# Patient Record
Sex: Female | Born: 1955
Health system: Southern US, Community
[De-identification: ages and names within clinical notes are randomized; demographics above are authoritative.]

## PROBLEM LIST (undated history)

## (undated) DIAGNOSIS — M8580 Other specified disorders of bone density and structure, unspecified site: Secondary | ICD-10-CM

## (undated) DIAGNOSIS — G43909 Migraine, unspecified, not intractable, without status migrainosus: Secondary | ICD-10-CM

## (undated) DIAGNOSIS — J45909 Unspecified asthma, uncomplicated: Secondary | ICD-10-CM

## (undated) DIAGNOSIS — N83209 Unspecified ovarian cyst, unspecified side: Secondary | ICD-10-CM

## (undated) DIAGNOSIS — H04129 Dry eye syndrome of unspecified lacrimal gland: Secondary | ICD-10-CM

## (undated) DIAGNOSIS — F329 Major depressive disorder, single episode, unspecified: Secondary | ICD-10-CM

## (undated) DIAGNOSIS — K819 Cholecystitis, unspecified: Secondary | ICD-10-CM

## (undated) DIAGNOSIS — I209 Angina pectoris, unspecified: Secondary | ICD-10-CM

## (undated) DIAGNOSIS — F32A Depression, unspecified: Secondary | ICD-10-CM

## (undated) DIAGNOSIS — R7611 Nonspecific reaction to tuberculin skin test without active tuberculosis: Secondary | ICD-10-CM

## (undated) HISTORY — DX: Nonspecific reaction to tuberculin skin test without active tuberculosis: R76.11

## (undated) HISTORY — DX: Unspecified asthma, uncomplicated: J45.909

## (undated) HISTORY — DX: Unspecified ovarian cyst, unspecified side: N83.209

## (undated) HISTORY — DX: Other specified disorders of bone density and structure, unspecified site: M85.80

## (undated) HISTORY — DX: Dry eye syndrome of unspecified lacrimal gland: H04.129

---

## 1979-01-28 HISTORY — PX: OTHER SURGICAL HISTORY: SHX169

## 1980-01-28 HISTORY — PX: ABDOMINAL HYSTERECTOMY: SHX81

## 1995-01-28 HISTORY — PX: TONSILLECTOMY AND ADENOIDECTOMY: SUR1326

## 1997-06-27 ENCOUNTER — Other Ambulatory Visit: Admission: RE | Admit: 1997-06-27 | Discharge: 1997-06-27 | Payer: Self-pay | Admitting: Obstetrics and Gynecology

## 1997-06-30 ENCOUNTER — Ambulatory Visit (HOSPITAL_COMMUNITY): Admission: RE | Admit: 1997-06-30 | Discharge: 1997-06-30 | Payer: Self-pay | Admitting: Obstetrics and Gynecology

## 1997-08-07 ENCOUNTER — Ambulatory Visit (HOSPITAL_COMMUNITY): Admission: RE | Admit: 1997-08-07 | Discharge: 1997-08-07 | Payer: Self-pay | Admitting: Oral & Maxillofacial Surgery

## 1998-06-26 ENCOUNTER — Encounter: Payer: Self-pay | Admitting: Obstetrics and Gynecology

## 1998-06-26 ENCOUNTER — Ambulatory Visit (HOSPITAL_COMMUNITY): Admission: RE | Admit: 1998-06-26 | Discharge: 1998-06-26 | Payer: Self-pay | Admitting: Obstetrics and Gynecology

## 1999-09-17 ENCOUNTER — Encounter: Payer: Self-pay | Admitting: Obstetrics and Gynecology

## 1999-09-17 ENCOUNTER — Ambulatory Visit (HOSPITAL_COMMUNITY): Admission: RE | Admit: 1999-09-17 | Discharge: 1999-09-17 | Payer: Self-pay | Admitting: Obstetrics and Gynecology

## 2000-03-20 ENCOUNTER — Other Ambulatory Visit: Admission: RE | Admit: 2000-03-20 | Discharge: 2000-03-20 | Payer: Self-pay | Admitting: Obstetrics and Gynecology

## 2001-05-04 ENCOUNTER — Ambulatory Visit (HOSPITAL_COMMUNITY): Admission: RE | Admit: 2001-05-04 | Discharge: 2001-05-04 | Payer: Self-pay | Admitting: Obstetrics and Gynecology

## 2001-05-04 ENCOUNTER — Encounter: Payer: Self-pay | Admitting: Obstetrics and Gynecology

## 2002-05-09 ENCOUNTER — Encounter: Payer: Self-pay | Admitting: Obstetrics and Gynecology

## 2002-05-09 ENCOUNTER — Encounter: Admission: RE | Admit: 2002-05-09 | Discharge: 2002-05-09 | Payer: Self-pay | Admitting: Obstetrics and Gynecology

## 2003-05-11 ENCOUNTER — Encounter: Admission: RE | Admit: 2003-05-11 | Discharge: 2003-05-11 | Payer: Self-pay | Admitting: Obstetrics and Gynecology

## 2004-04-21 ENCOUNTER — Emergency Department (HOSPITAL_COMMUNITY): Admission: EM | Admit: 2004-04-21 | Discharge: 2004-04-21 | Payer: Self-pay | Admitting: Family Medicine

## 2004-05-13 ENCOUNTER — Encounter: Admission: RE | Admit: 2004-05-13 | Discharge: 2004-05-13 | Payer: Self-pay | Admitting: Obstetrics and Gynecology

## 2004-08-21 ENCOUNTER — Encounter: Admission: RE | Admit: 2004-08-21 | Discharge: 2004-08-21 | Payer: Self-pay | Admitting: Internal Medicine

## 2004-08-29 ENCOUNTER — Encounter: Admission: RE | Admit: 2004-08-29 | Discharge: 2004-08-29 | Payer: Self-pay | Admitting: Internal Medicine

## 2004-09-04 ENCOUNTER — Encounter: Admission: RE | Admit: 2004-09-04 | Discharge: 2004-09-04 | Payer: Self-pay | Admitting: Internal Medicine

## 2005-05-16 ENCOUNTER — Ambulatory Visit (HOSPITAL_COMMUNITY): Admission: RE | Admit: 2005-05-16 | Discharge: 2005-05-16 | Payer: Self-pay | Admitting: Obstetrics & Gynecology

## 2005-07-07 ENCOUNTER — Ambulatory Visit (HOSPITAL_COMMUNITY): Admission: RE | Admit: 2005-07-07 | Discharge: 2005-07-07 | Payer: Self-pay | Admitting: Gastroenterology

## 2005-07-07 ENCOUNTER — Encounter (INDEPENDENT_AMBULATORY_CARE_PROVIDER_SITE_OTHER): Payer: Self-pay | Admitting: Specialist

## 2006-06-09 ENCOUNTER — Ambulatory Visit (HOSPITAL_COMMUNITY): Admission: RE | Admit: 2006-06-09 | Discharge: 2006-06-09 | Payer: Self-pay | Admitting: Obstetrics & Gynecology

## 2006-08-02 IMAGING — CR DG FOOT COMPLETE 3+V*L*
3 series · 3 of 3 positions shown · non-contrast
Comparison: none

CLINICAL DATA: Stepped in a hole.  Pain and swelling particularly laterally.  
 LEFT ANKLE ? 3 VIEW:
 No joint effusion.  There is some lateral soft tissue swelling but no evidence of fracture or dislocation.

[view not recorded (1 of 3)]
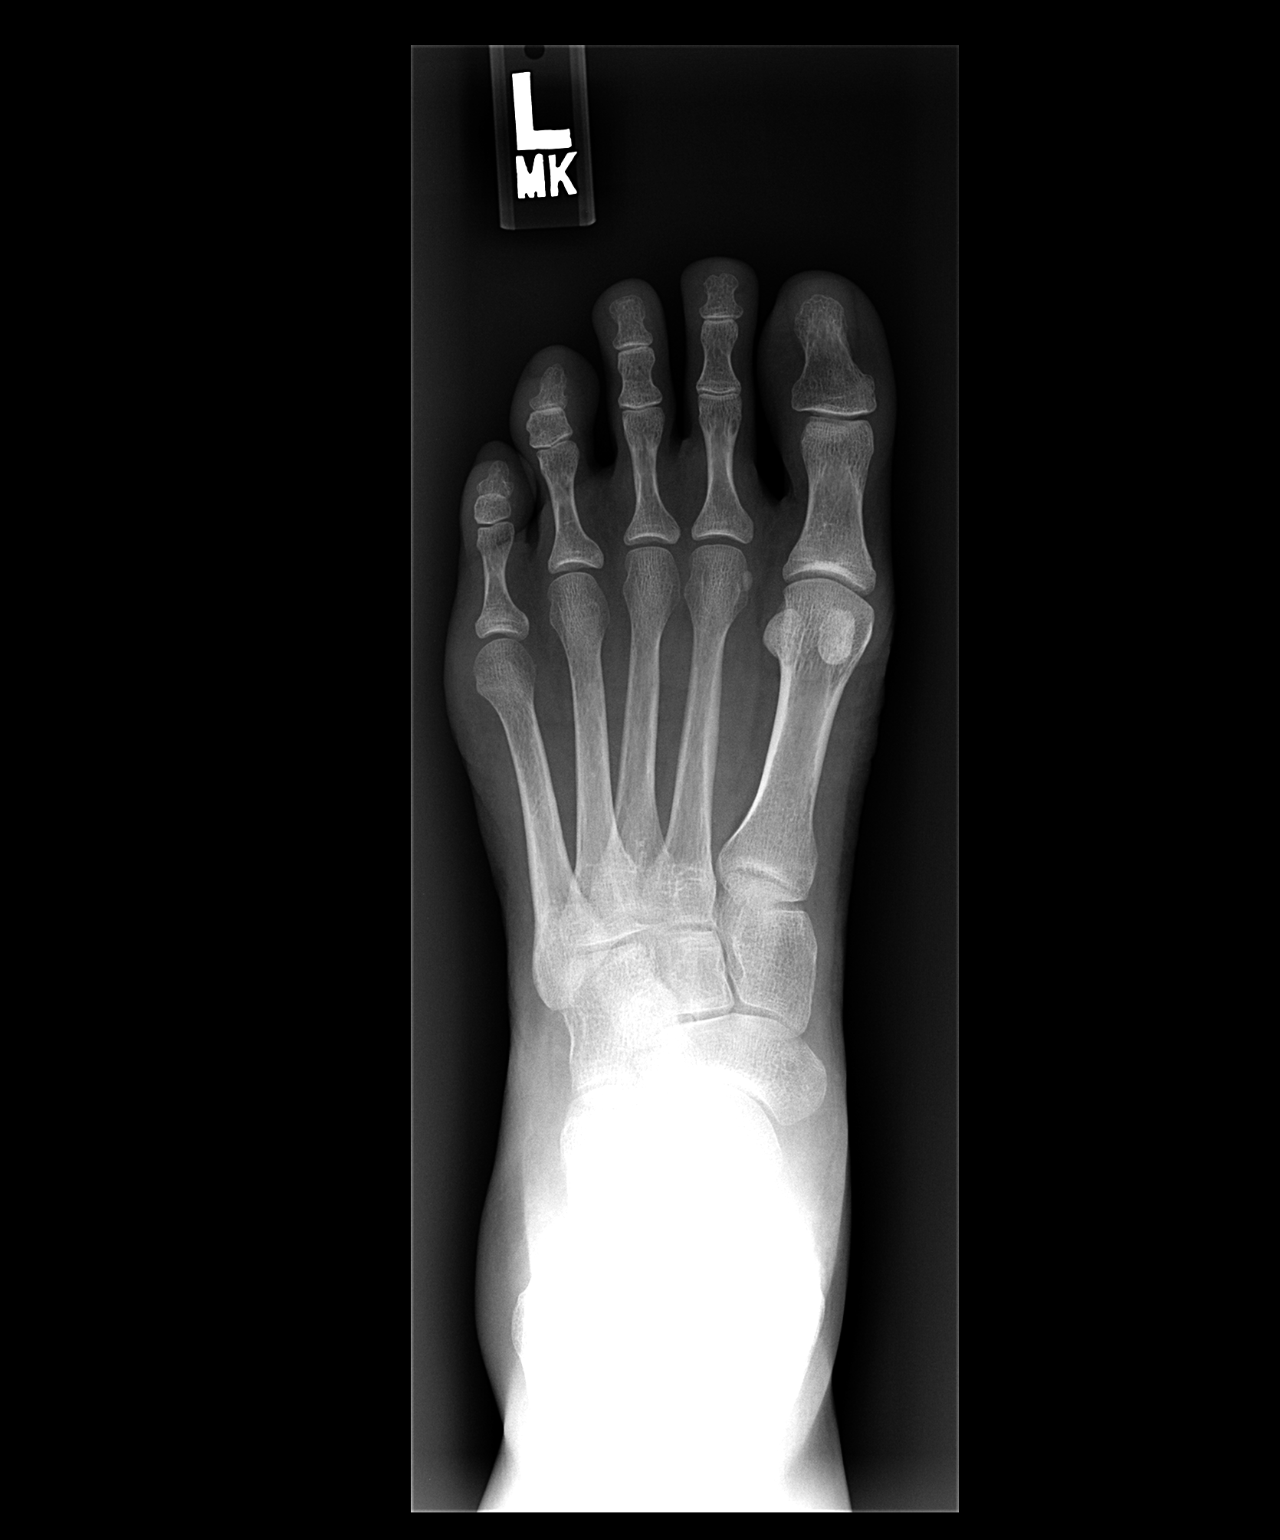

[view not recorded (2 of 3)]
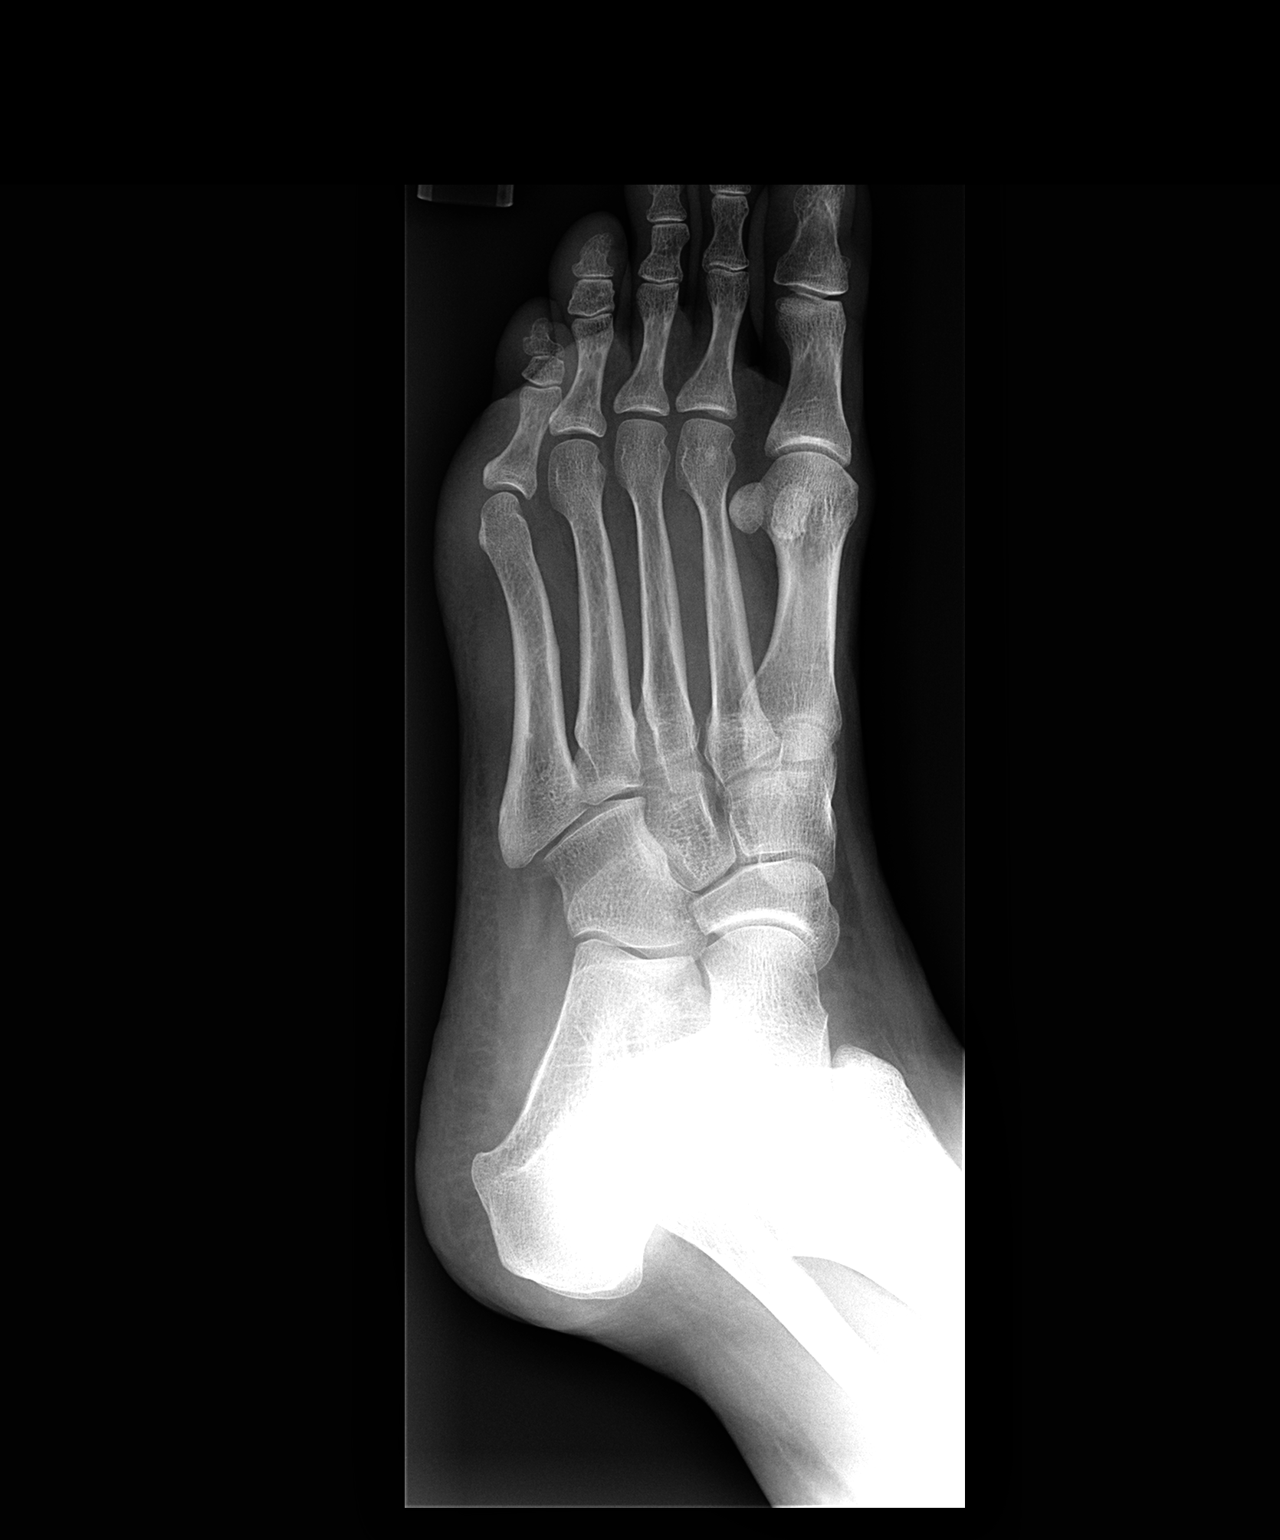

[view not recorded (3 of 3)]
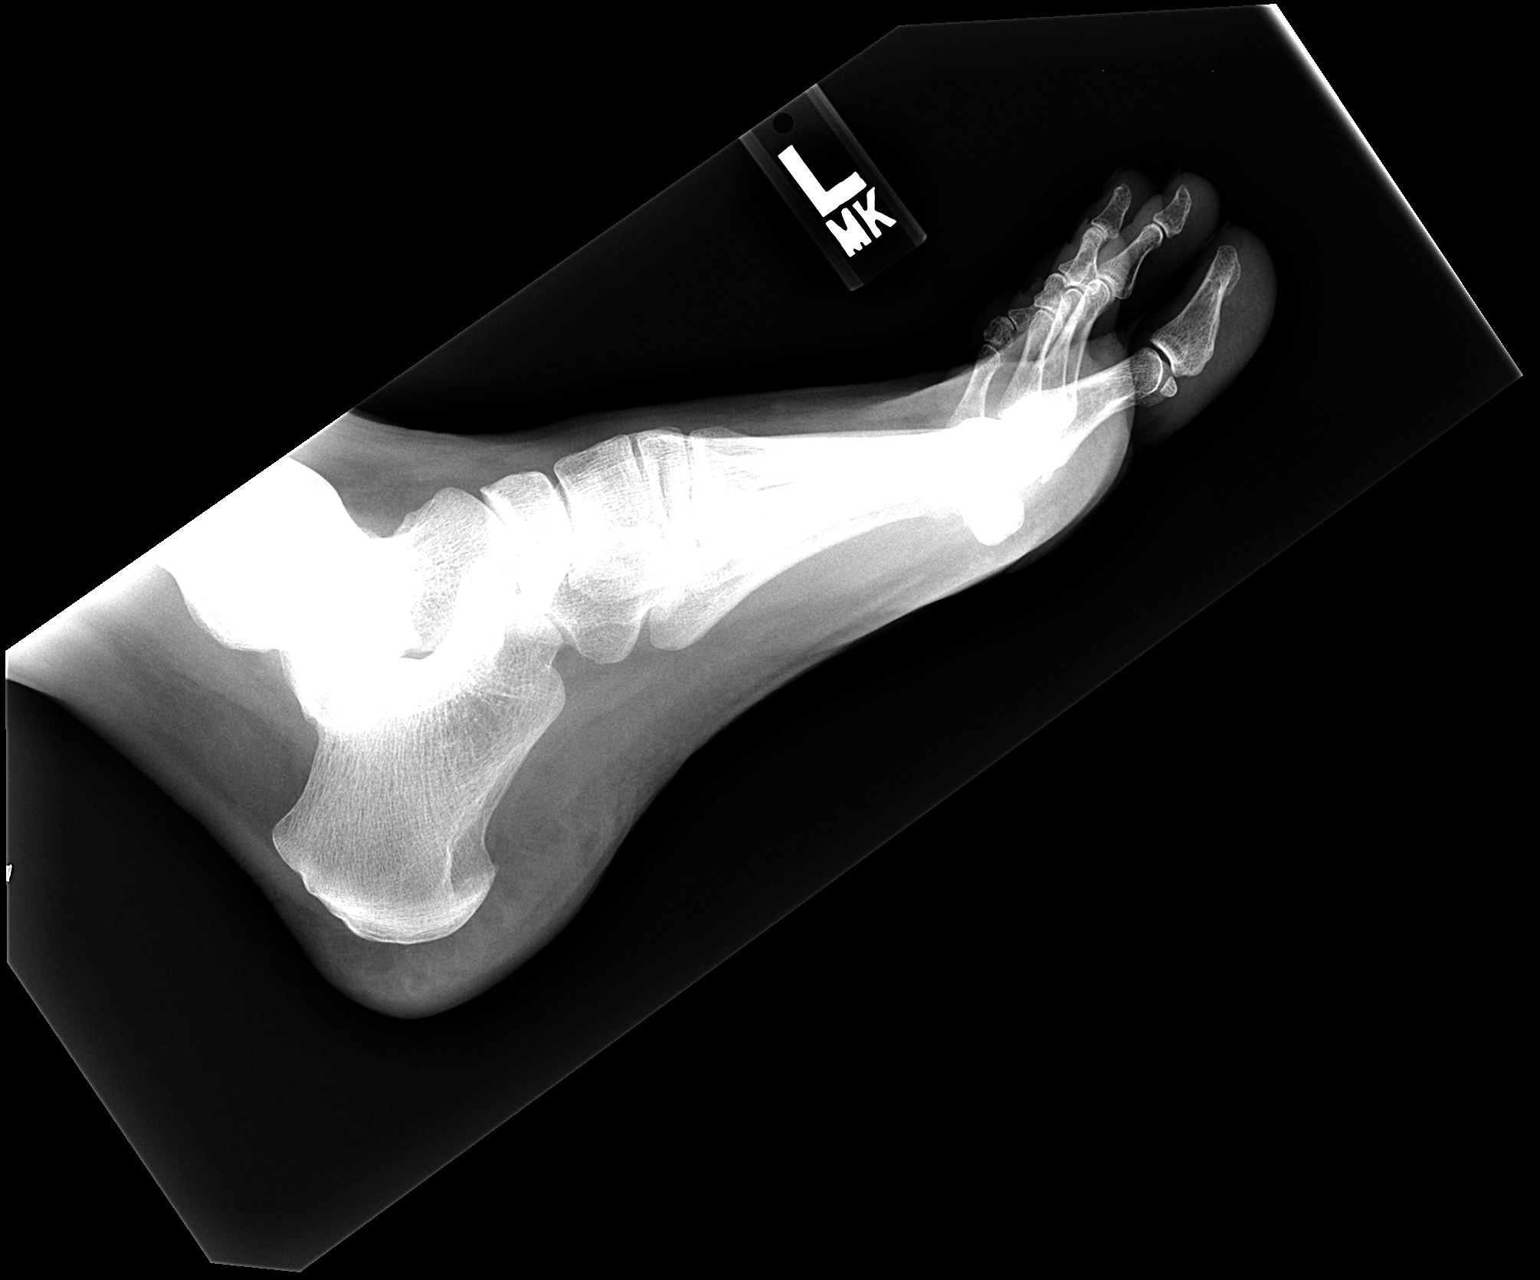

[3 of 3 positions shown; findings below may reference images not displayed]

IMPRESSION: See above report.
 LEFT FOOT ? 3 VIEW:
 No evidence of fracture or dislocation.
IMPRESSION: Negative.

## 2007-06-22 ENCOUNTER — Ambulatory Visit (HOSPITAL_COMMUNITY): Admission: RE | Admit: 2007-06-22 | Discharge: 2007-06-22 | Payer: Self-pay | Admitting: Obstetrics & Gynecology

## 2007-06-28 ENCOUNTER — Other Ambulatory Visit: Admission: RE | Admit: 2007-06-28 | Discharge: 2007-06-28 | Payer: Self-pay | Admitting: Obstetrics & Gynecology

## 2008-06-22 ENCOUNTER — Ambulatory Visit (HOSPITAL_COMMUNITY): Admission: RE | Admit: 2008-06-22 | Discharge: 2008-06-22 | Payer: Self-pay | Admitting: Obstetrics & Gynecology

## 2009-07-03 ENCOUNTER — Ambulatory Visit (HOSPITAL_COMMUNITY): Admission: RE | Admit: 2009-07-03 | Discharge: 2009-07-03 | Payer: Self-pay | Admitting: Obstetrics & Gynecology

## 2010-02-17 ENCOUNTER — Encounter: Payer: Self-pay | Admitting: Obstetrics & Gynecology

## 2010-06-14 NOTE — Op Note (Signed)
NAME:  Miranda Valentine, Miranda Valentine                ACCOUNT NO.:  [id number]   MEDICAL RECORD NO.:  [id number]          PATIENT TYPE:  AMB   LOCATION:  ENDO                         FACILITY:  [facility]   PHYSICIAN:  Anselmo Rod, M.D.  DATE OF BIRTH:  [birth year]   DATE OF PROCEDURE:  07/07/2005  DATE OF DISCHARGE:                                 OPERATIVE REPORT   PROCEDURE PERFORMED:  Colonoscopy with cold biopsies x 2.   ENDOSCOPIST:  Anselmo Rod, M.D.   INSTRUMENT USED:  Olympus video colonoscope.   INDICATIONS FOR PROCEDURE:  A 55 year old white female undergoing screening  colonoscopy to rule out colonic polyps, masses, etc.   PREPROCEDURE PREPARATION:  Informed consent was procured from the patient.  The patient was fasted for four hours prior to the procedure and prepped  with OsmoPrep pills the night of and the morning of the procedure.  The  risks and benefits of the procedure including a 10% miss rate for cancer or  polyps was discussed with the patient as well.   PREPROCEDURE PHYSICAL:  The patient had stable vital signs.  Neck supple.  Chest clear to auscultation.  S1 and S2 regular.  Abdomen soft with normal  bowel sounds.   DESCRIPTION OF PROCEDURE:  The patient was placed in left lateral decubitus  position and sedated with 50 mcg of fentanyl and  mg of Versed in slow  incremental doses.  Once the patient was adequately sedated and maintained  on low flow oxygen and continuous cardiac monitoring, the Olympus video  colonoscope was advanced from the rectum to the cecum.  The appendicular  orifice and ileocecal valve were clearly visualized and photographed.  The  terminal ileum appeared normal and without lesion.  Two small sessile polyps  were biopsied from the cecum (cold biopsies x2).  The rest of the exam was  unremarkable.  Retroflexion in the rectum revealed small internal  hemorrhoids.  Rest of the colonic mucosa appeared healthy and without  lesions.   IMPRESSION:   1.Small internal hemorrhoids.  2.Two small sessile polyps biopsied from the cecum.  3.Otherwise normal exam up to the terminal ileum.   RECOMMENDATIONS:  1.Await pathology results.  2.Avoid all nonsteroidals including aspirin for the next two weeks.  3.Outpatient followup as needed in the future.      Anselmo Rod, M.D.  Electronically Signed     JNM/MEDQ  D:  07/07/2005  T:  07/07/2005  Job:  [id number]   cc:   M. Leda Quail, MD   Candyce Churn. Allyne Gee, M.D.  Fax: [phone]

## 2010-06-14 NOTE — Op Note (Signed)
NAME:  Miranda Valentine, Miranda Valentine                ACCOUNT NO.:  [id number]   MEDICAL RECORD NO.:  [id number]          PATIENT TYPE:  AMB   LOCATION:  ENDO                         FACILITY:  [facility]   PHYSICIAN:  Anselmo Rod, M.D.  DATE OF BIRTH:  [birth year]   DATE OF PROCEDURE:  07/07/2005  DATE OF DISCHARGE:                                 OPERATIVE REPORT   PROCEDURE:  Colonoscopy with cold biopsies x2.   ENDOSCOPIST:  Anselmo Rod, M.D.   INSTRUMENT USED:  Olympus video colonoscope.   INDICATIONS FOR PROCEDURE:  This 55 year old white female underwent a  screening colonoscopy to rule out colonic polyps, masses, etc.   PRE-PROCEDURE PREPARATION:  An informed consent was procured from the  patient.  The patient was fasted for four hours prior to prior to the  procedure and prepped with Osmo prep on the morning of the procedure.  The  risks and benefits of the procedure, including a 10% miss rate of cancer  were discussed with the patient as well.   PRE-PROCEDURE PHYSICAL EXAMINATION:  VITAL SIGNS:  Stable.  NECK:  Supple.  CHEST:  Clear to auscultation.  HEART:  S1, S2 regular.  ABDOMEN:  Soft with normal bowel sounds.   DESCRIPTION OF PROCEDURE:  The patient was placed in the left lateral  decubitus position and sedated with 50 mcg of fentanyl and 5 mg of Versed in  slow incremental doses. Once the patient was adequately sedated and  maintained on low-flow oxygen and continuous cardiac monitoring, the Olympus  video colonoscope was advanced from the rectum to the cecum.  The  appendiceal orifice and ileocecal valve were visualized and photographed.  Two small sessile polyps was removed by cold biopsy forceps from the  rectocecum. The terminal ileum appeared healthy and without lesions.  Small  internal hemorrhoids were seen on retroflexion.  The rest of the exam was  unremarkable.   The patient tolerated the procedure well without complications.  There was  no evidence of  diverticulosis.   IMPRESSION:  1.Small internal hemorrhoids.  2.Two small sessile polyps, biopsied from the cecum.  3.Otherwise normal examination up to the terminal ileum.   RECOMMENDATIONS:  1.Await pathology results.  2.Avoid all non-steroidals including aspirin for the next two weeks.  3.Repeat colonoscopy, depending upon the pathology results.  4.Outpatient followup as the need arises in the future.      Anselmo Rod, M.D.  Electronically Signed     JNM/MEDQ  D:  07/07/2005  T:  07/07/2005  Job:  [id number]   cc:   M. Leda Quail, MD   Candyce Churn. Allyne Gee, M.D.  Fax: [phone]

## 2010-06-26 ENCOUNTER — Other Ambulatory Visit: Payer: Self-pay | Admitting: Obstetrics & Gynecology

## 2010-06-26 DIAGNOSIS — Z1231 Encounter for screening mammogram for malignant neoplasm of breast: Secondary | ICD-10-CM

## 2010-07-16 ENCOUNTER — Ambulatory Visit (HOSPITAL_COMMUNITY)
Admission: RE | Admit: 2010-07-16 | Discharge: 2010-07-16 | Disposition: A | Discharge status: Home / Self Care | Payer: 59 | Source: Ambulatory Visit | Attending: Obstetrics & Gynecology | Admitting: Obstetrics & Gynecology

## 2010-07-16 DIAGNOSIS — Z1231 Encounter for screening mammogram for malignant neoplasm of breast: Secondary | ICD-10-CM | POA: Insufficient documentation

## 2011-06-20 ENCOUNTER — Other Ambulatory Visit: Payer: Self-pay | Admitting: Obstetrics & Gynecology

## 2011-06-20 DIAGNOSIS — Z1231 Encounter for screening mammogram for malignant neoplasm of breast: Secondary | ICD-10-CM

## 2011-07-01 ENCOUNTER — Other Ambulatory Visit: Payer: Self-pay | Admitting: Obstetrics & Gynecology

## 2011-07-01 DIAGNOSIS — Z13820 Encounter for screening for osteoporosis: Secondary | ICD-10-CM

## 2011-07-21 ENCOUNTER — Ambulatory Visit (HOSPITAL_COMMUNITY): Payer: 59

## 2011-07-24 ENCOUNTER — Other Ambulatory Visit (HOSPITAL_COMMUNITY): Payer: Self-pay | Admitting: Obstetrics & Gynecology

## 2011-07-24 DIAGNOSIS — Z1231 Encounter for screening mammogram for malignant neoplasm of breast: Secondary | ICD-10-CM

## 2011-07-25 ENCOUNTER — Ambulatory Visit (HOSPITAL_COMMUNITY)
Admission: RE | Admit: 2011-07-25 | Discharge: 2011-07-25 | Disposition: A | Discharge status: Home / Self Care | Payer: 59 | Source: Ambulatory Visit | Attending: Obstetrics & Gynecology | Admitting: Obstetrics & Gynecology

## 2011-07-25 DIAGNOSIS — Z1231 Encounter for screening mammogram for malignant neoplasm of breast: Secondary | ICD-10-CM | POA: Insufficient documentation

## 2011-07-25 DIAGNOSIS — Z13820 Encounter for screening for osteoporosis: Secondary | ICD-10-CM

## 2011-11-07 ENCOUNTER — Observation Stay (HOSPITAL_COMMUNITY)
Admission: EM | Admit: 2011-11-07 | Discharge: 2011-11-08 | Disposition: A | Discharge status: Home / Self Care | Payer: 59 | Attending: Cardiovascular Disease | Admitting: Cardiovascular Disease

## 2011-11-07 ENCOUNTER — Emergency Department (HOSPITAL_COMMUNITY): Payer: 59

## 2011-11-07 ENCOUNTER — Other Ambulatory Visit: Payer: Self-pay

## 2011-11-07 ENCOUNTER — Encounter (HOSPITAL_COMMUNITY): Payer: Self-pay | Admitting: *Deleted

## 2011-11-07 DIAGNOSIS — I209 Angina pectoris, unspecified: Secondary | ICD-10-CM

## 2011-11-07 DIAGNOSIS — Z8249 Family history of ischemic heart disease and other diseases of the circulatory system: Secondary | ICD-10-CM | POA: Insufficient documentation

## 2011-11-07 DIAGNOSIS — G43909 Migraine, unspecified, not intractable, without status migrainosus: Secondary | ICD-10-CM | POA: Diagnosis present

## 2011-11-07 DIAGNOSIS — R0789 Other chest pain: Secondary | ICD-10-CM | POA: Diagnosis present

## 2011-11-07 DIAGNOSIS — R079 Chest pain, unspecified: Principal | ICD-10-CM | POA: Insufficient documentation

## 2011-11-07 DIAGNOSIS — F3289 Other specified depressive episodes: Secondary | ICD-10-CM | POA: Insufficient documentation

## 2011-11-07 DIAGNOSIS — F329 Major depressive disorder, single episode, unspecified: Secondary | ICD-10-CM | POA: Insufficient documentation

## 2011-11-07 HISTORY — DX: Angina pectoris, unspecified: I20.9

## 2011-11-07 HISTORY — DX: Major depressive disorder, single episode, unspecified: F32.9

## 2011-11-07 HISTORY — DX: Depression, unspecified: F32.A

## 2011-11-07 HISTORY — DX: Migraine, unspecified, not intractable, without status migrainosus: G43.909

## 2011-11-07 LAB — CBC
HCT: 37.6 % (ref 36.0–46.0)
Hemoglobin: 12.5 g/dL (ref 12.0–15.0)
MCH: 29.2 pg (ref 26.0–34.0)
MCHC: 33.2 g/dL (ref 30.0–36.0)
MCV: 87.9 fL (ref 78.0–100.0)
Platelets: 258 10*3/uL (ref 150–400)
RBC: 4.28 MIL/uL (ref 3.87–5.11)
RDW: 12.7 % (ref 11.5–15.5)
WBC: 4.9 10*3/uL (ref 4.0–10.5)

## 2011-11-07 LAB — COMPREHENSIVE METABOLIC PANEL
ALT: 17 U/L (ref 0–35)
AST: 17 U/L (ref 0–37)
Albumin: 3.7 g/dL (ref 3.5–5.2)
Alkaline Phosphatase: 101 U/L (ref 39–117)
BUN: 19 mg/dL (ref 6–23)
CO2: 23 mEq/L (ref 19–32)
Calcium: 9.7 mg/dL (ref 8.4–10.5)
Chloride: 108 mEq/L (ref 96–112)
Creatinine, Ser: 0.71 mg/dL (ref 0.50–1.10)
GFR calc Af Amer: >90 mL/min (ref >90)
GFR calc non Af Amer: >90 mL/min (ref >90)
Glucose, Bld: 67 mg/dL — ABNORMAL LOW (ref 70–99)
Potassium: 3.8 mEq/L (ref 3.5–5.1)
Sodium: 141 mEq/L (ref 135–145)
Total Bilirubin: 0.2 mg/dL — ABNORMAL LOW (ref 0.3–1.2)
Total Protein: 6.9 g/dL (ref 6.0–8.3)

## 2011-11-07 LAB — D-DIMER, QUANTITATIVE: D-Dimer, Quant: <0.27 ug/mL-FEU (ref 0.00–0.48)

## 2011-11-07 LAB — CBC WITH DIFFERENTIAL/PLATELET
Basophils Absolute: 0 10*3/uL (ref 0.0–0.1)
Basophils Relative: 0 % (ref 0–1)
Eosinophils Absolute: 0.2 10*3/uL (ref 0.0–0.7)
Eosinophils Relative: 3 % (ref 0–5)
HCT: 39.7 % (ref 36.0–46.0)
Hemoglobin: 13 g/dL (ref 12.0–15.0)
Lymphocytes Relative: 37 % (ref 12–46)
Lymphs Abs: 1.8 10*3/uL (ref 0.7–4.0)
MCH: 29 pg (ref 26.0–34.0)
MCHC: 32.7 g/dL (ref 30.0–36.0)
MCV: 88.6 fL (ref 78.0–100.0)
Monocytes Absolute: 0.3 10*3/uL (ref 0.1–1.0)
Monocytes Relative: 6 % (ref 3–12)
Neutro Abs: 2.6 10*3/uL (ref 1.7–7.7)
Neutrophils Relative %: 54 % (ref 43–77)
Platelets: 276 10*3/uL (ref 150–400)
RBC: 4.48 MIL/uL (ref 3.87–5.11)
RDW: 12.6 % (ref 11.5–15.5)
WBC: 4.9 10*3/uL (ref 4.0–10.5)

## 2011-11-07 LAB — POCT I-STAT TROPONIN I: Troponin i, poc: 0 ng/mL (ref 0.00–0.08)

## 2011-11-07 MED ORDER — ADULT MULTIVITAMIN W/MINERALS CH
1.0000 | ORAL_TABLET | Freq: Every day | ORAL | Status: DC
  Administered 2011-11-08: 1 via ORAL
  Filled 2011-11-07: qty 1

## 2011-11-07 MED ORDER — ENOXAPARIN SODIUM 40 MG/0.4ML ~~LOC~~ SOLN
40.0000 mg | SUBCUTANEOUS | Status: DC
  Administered 2011-11-07: 40 mg via SUBCUTANEOUS
  Filled 2011-11-07 (×2): qty 0.4

## 2011-11-07 MED ORDER — ARIPIPRAZOLE 2 MG PO TABS
2.0000 mg | ORAL_TABLET | Freq: Every day | ORAL | Status: DC
  Administered 2011-11-08: 2 mg via ORAL
  Filled 2011-11-07: qty 1

## 2011-11-07 MED ORDER — SODIUM CHLORIDE 0.9 % IJ SOLN: 3.0000 mL | INTRAMUSCULAR | Status: DC | PRN

## 2011-11-07 MED ORDER — AZELASTINE HCL 0.1 % NA SOLN
2.0000 | Freq: Every day | NASAL | Status: DC
  Administered 2011-11-08: 2 via NASAL
  Filled 2011-11-07: qty 30

## 2011-11-07 MED ORDER — SERTRALINE HCL 50 MG PO TABS: 250.0000 mg | ORAL_TABLET | Freq: Every day | ORAL | Status: DC

## 2011-11-07 MED ORDER — SODIUM CHLORIDE 0.9 % IV SOLN: 250.0000 mL | INTRAVENOUS | Status: DC | PRN

## 2011-11-07 MED ORDER — ACETAMINOPHEN 325 MG PO TABS: 650.0000 mg | ORAL_TABLET | ORAL | Status: DC | PRN

## 2011-11-07 MED ORDER — SODIUM CHLORIDE 0.9 % IV SOLN
INTRAVENOUS | Status: DC
  Administered 2011-11-07: 11:00:00 via INTRAVENOUS
  Administered 2011-11-08: 1000 mL via INTRAVENOUS

## 2011-11-07 MED ORDER — ONDANSETRON HCL 4 MG/2ML IJ SOLN: 4.0000 mg | Freq: Four times a day (QID) | INTRAMUSCULAR | Status: DC | PRN

## 2011-11-07 MED ORDER — SODIUM CHLORIDE 0.9 % IJ SOLN
3.0000 mL | Freq: Two times a day (BID) | INTRAMUSCULAR | Status: DC
  Administered 2011-11-08: 3 mL via INTRAVENOUS

## 2011-11-07 MED ORDER — THERA M PLUS PO TABS: 1.0000 | ORAL_TABLET | Freq: Every day | ORAL | Status: DC

## 2011-11-07 MED ORDER — LORATADINE 10 MG PO TABS
10.0000 mg | ORAL_TABLET | Freq: Every day | ORAL | Status: DC | PRN
  Filled 2011-11-07: qty 1

## 2011-11-07 MED ORDER — SERTRALINE HCL 100 MG PO TABS
200.0000 mg | ORAL_TABLET | Freq: Every day | ORAL | Status: DC
  Administered 2011-11-07: 200 mg via ORAL
  Filled 2011-11-07 (×2): qty 2

## 2011-11-07 MED ORDER — ESTRADIOL 1 MG PO TABS
0.5000 mg | ORAL_TABLET | Freq: Every day | ORAL | Status: DC
  Administered 2011-11-08: 0.5 mg via ORAL
  Filled 2011-11-07: qty 0.5

## 2011-11-07 MED ORDER — ZOLPIDEM TARTRATE 5 MG PO TABS
5.0000 mg | ORAL_TABLET | Freq: Every evening | ORAL | Status: DC | PRN
  Administered 2011-11-07: 5 mg via ORAL
  Filled 2011-11-07: qty 1

## 2011-11-07 MED ORDER — ASPIRIN 81 MG PO CHEW
81.0000 mg | CHEWABLE_TABLET | Freq: Every day | ORAL | Status: DC
  Administered 2011-11-08: 81 mg via ORAL
  Filled 2011-11-07: qty 1

## 2011-11-07 MED ORDER — ASPIRIN 325 MG PO TABS
325.0000 mg | ORAL_TABLET | ORAL | Status: AC
  Administered 2011-11-07: 325 mg via ORAL
  Filled 2011-11-07: qty 1

## 2011-11-07 MED ORDER — FLUTICASONE PROPIONATE 50 MCG/ACT NA SUSP
2.0000 | Freq: Every day | NASAL | Status: DC
  Administered 2011-11-08: 2 via NASAL
  Filled 2011-11-07: qty 16

## 2011-11-07 MED ORDER — NITROGLYCERIN 0.4 MG SL SUBL: 0.4000 mg | SUBLINGUAL_TABLET | SUBLINGUAL | Status: DC | PRN

## 2011-11-07 MED ORDER — ALPRAZOLAM 0.25 MG PO TABS: 0.2500 mg | ORAL_TABLET | Freq: Three times a day (TID) | ORAL | Status: DC | PRN

## 2011-11-07 MED ORDER — TOPIRAMATE 100 MG PO TABS
100.0000 mg | ORAL_TABLET | Freq: Every day | ORAL | Status: DC
  Administered 2011-11-08: 100 mg via ORAL
  Filled 2011-11-07: qty 1

## 2011-11-07 NOTE — H&P (Signed)
Patient ID: Miranda Valentine MRN: [id number], DOB/AGE: 07-31-1955   Admit date: 11/07/2011   Primary Physician: Gwynneth Aliment, MD Primary Cardiologist: Dr Royann Shivers (new)  HPI: Pleasant 56 y/o female followed by Dr R. Sanders. She has no history of CAD, prior chest pain, or previous cardiac work up. She mentioned to her Dtr in-law, who is an Engineer, building services, that she had been having Lt sided chest pain off and on for the past week. There seems to be an exertional component. Her symptoms are more frequent the last 48hrs. She was advised to see her PMD but could not get in and was advised to go to the ER.  She has had an ASA but no other medications and is currently pain free. She denies any associated SOB, nausea, or radiation to her jaw, back, or elbows.   Problem List: Past Medical History  Diagnosis Date  . Depression     Past Surgical History  Procedure Date  . Abdominal hysterectomy      Allergies: No Known Allergies   Home Medications  See Med Rec   No family history on file.   History   Social History  . Marital Status: Married    Spouse Name: N/A    Number of Children: N/A  . Years of Education: N/A   Occupational History  . Not on file.   Social History Main Topics  . Smoking status: Not on file  . Smokeless tobacco: Not on file  . Alcohol Use: Yes  . Drug Use:   . Sexually Active:    Other Topics Concern  . Not on file   Social History Narrative  . No narrative on file     Review of Systems: General: negative for chills, fever, night sweats or weight changes.  Cardiovascular: negative for chest pain, dyspnea on exertion, edema, orthopnea, palpitations, paroxysmal nocturnal dyspnea or shortness of breath Dermatological: negative for rash Respiratory: negative for cough or wheezing Urologic: negative for hematuria Abdominal: negative for nausea, vomiting, diarrhea, bright red blood per rectum, melena, or hematemesis Neurologic: negative for visual changes,  syncope, or dizziness All other systems reviewed and are otherwise negative except as noted above.  Physical Exam: Blood pressure 105/41, pulse 60, temperature 97 F (36.1 C), temperature source Oral, resp. rate 18, SpO2 100.00%.  General appearance: alert, cooperative and no distress Neck: no adenopathy, no carotid bruit, no JVD, supple, symmetrical, trachea midline and thyroid not enlarged, symmetric, no tenderness/mass/nodules Lungs: clear to auscultation bilaterally Heart: regular rate and rhythm, S1, S2 normal, no murmur, click, rub or gallop Abdomen: soft, non-tender; bowel sounds normal; no masses,  no organomegaly Extremities: extremities normal, atraumatic, no cyanosis or edema Pulses: 2+ and symmetric Skin: Skin color, texture, turgor normal. No rashes or lesions Neurologic: Grossly normal    Labs:   Results for orders placed during the hospital encounter of 11/07/11 (from the past 24 hour(s))  D-DIMER, QUANTITATIVE     Status: Normal   Collection Time   11/07/11 10:33 AM      Component Value Range   D-Dimer, Quant <0.27  0.00 - 0.48 ug/mL-FEU  CBC WITH DIFFERENTIAL     Status: Normal   Collection Time   11/07/11 10:33 AM      Component Value Range   WBC 4.9  4.0 - 10.5 K/uL   RBC 4.48  3.87 - 5.11 MIL/uL   Hemoglobin 13.0  12.0 - 15.0 g/dL   HCT 40.9  81.1 - 91.4 %   MCV  88.6  78.0 - 100.0 fL   MCH 29.0  26.0 - 34.0 pg   MCHC 32.7  30.0 - 36.0 g/dL   RDW 16.1  09.6 - 04.5 %   Platelets 276  150 - 400 K/uL   Neutrophils Relative 54  43 - 77 %   Neutro Abs 2.6  1.7 - 7.7 K/uL   Lymphocytes Relative 37  12 - 46 %   Lymphs Abs 1.8  0.7 - 4.0 K/uL   Monocytes Relative 6  3 - 12 %   Monocytes Absolute 0.3  0.1 - 1.0 K/uL   Eosinophils Relative 3  0 - 5 %   Eosinophils Absolute 0.2  0.0 - 0.7 K/uL   Basophils Relative 0  0 - 1 %   Basophils Absolute 0.0  0.0 - 0.1 K/uL  COMPREHENSIVE METABOLIC PANEL     Status: Abnormal   Collection Time   11/07/11 10:33 AM       Component Value Range   Sodium 141  135 - 145 mEq/L   Potassium 3.8  3.5 - 5.1 mEq/L   Chloride 108  96 - 112 mEq/L   CO2 23  19 - 32 mEq/L   Glucose, Bld 67 (*) 70 - 99 mg/dL   BUN 19  6 - 23 mg/dL   Creatinine, Ser 4.09  0.50 - 1.10 mg/dL   Calcium 9.7  8.4 - 81.1 mg/dL   Total Protein 6.9  6.0 - 8.3 g/dL   Albumin 3.7  3.5 - 5.2 g/dL   AST 17  0 - 37 U/L   ALT 17  0 - 35 U/L   Alkaline Phosphatase 101  39 - 117 U/L   Total Bilirubin 0.2 (*) 0.3 - 1.2 mg/dL   GFR calc non Af Amer >90  >90 mL/min   GFR calc Af Amer >90  >90 mL/min  POCT I-STAT TROPONIN I     Status: Normal   Collection Time   11/07/11 10:35 AM      Component Value Range   Troponin i, poc 0.00  0.00 - 0.08 ng/mL   Comment 3              Radiology/Studies: Dg Chest 2 View  11/07/2011  *RADIOLOGY REPORT*  Clinical Data: Pain.  CHEST - 2 VIEW  Comparison: None.  Findings: Heart and mediastinal contours are within normal limits. No focal opacities or effusions.  No acute bony abnormality.  IMPRESSION: No active cardiopulmonary disease.   Original Report Authenticated By: Cyndie Chime, M.D.     EKG:NSR, low voltage, no acute changes  ASSESSMENT AND PLAN:  Principal Problem:  *Chest pain, localized Active Problems:  Family history of coronary arteriosclerosis  Migraine headache history  Plan- MD to see, ? OP Myoview.  Deland Pretty, PA-C 11/07/2011, 2:22 PM  I have seen and examined the patient along with Corine Shelter, PA-C.  I have reviewed the chart, notes and new data.  I agree with PA's note.  Key new complaints: milder, persistent chest discomfort is still there. Chest pain is worse with exertion, but is now present at rest Key examination changes: normal exam Key new findings / data: normal ecg and cardiac enzymes.  PLAN: Coronary risk factor profile is low risk and ECG and enzyme results are favorable, but her symptoms are highly suggestive of new onset and accelerating angina. Place in  observation and do stress myocardial perfusion study in AM.  Thurmon Fair, MD, [facility] [phone] 11/07/2011, 2:51 PM

## 2011-11-07 NOTE — ED Notes (Signed)
Pt with with left sided CP that comes and goes for one week.  WOrse with exertion.  No n/v or diaphoresis with this.  No SOB with this.  Pt has back pain with this

## 2011-11-07 NOTE — ED Notes (Signed)
Pt from home.  Reports having (L) sided CP x 1 week-started intermittently and has now become more constant.  Reports having increased pain with exertion and lying down.  Denies tenderness on palpation.  Denies N/V/SOB/diaphoresis.  Pt A/O x 4, no acute distress noted.  Family at bedside.

## 2011-11-07 NOTE — ED Provider Notes (Signed)
History     CSN: [id number]  Arrival date & time 11/07/11  5621   First MD Initiated Contact with Patient 11/07/11 1013      Chief Complaint  Patient presents with  . Chest Pain    (Consider location/radiation/quality/duration/timing/severity/associated sxs/prior treatment) The history is provided by the patient.   patient here with one week of intermittent chest pain which is been constant for the last 2 days. Pain is localized to her left breast and has been worse with exertion as well as certain positions. No associated recent cough or fever. Some dyspnea without recent leg pain or swelling. No rashes noted. Pain does radiate to her neck. No medications taken for this prior to arrival. No prior history of same  Past Medical History  Diagnosis Date  . Depression     Past Surgical History  Procedure Date  . Abdominal hysterectomy     No family history on file.  History  Substance Use Topics  . Smoking status: Not on file  . Smokeless tobacco: Not on file  . Alcohol Use: Yes    OB History    Grav Para Term Preterm Abortions TAB SAB Ect Mult Living                  Review of Systems  All other systems reviewed and are negative.    Allergies  Review of patient's allergies indicates no known allergies.  Home Medications  No current outpatient prescriptions on file.  BP 104/58  Pulse 69  Temp 97 F (36.1 C) (Oral)  Resp 16  SpO2 99%  Physical Exam  Nursing note and vitals reviewed. Constitutional: She is oriented to person, place, and time. She appears well-developed and well-nourished.  Non-toxic appearance. No distress.  HENT:  Head: Normocephalic and atraumatic.  Eyes: Conjunctivae normal, EOM and lids are normal. Pupils are equal, round, and reactive to light.  Neck: Normal range of motion. Neck supple. No tracheal deviation present. No mass present.  Cardiovascular: Normal rate, regular rhythm and normal heart sounds.  Exam reveals no gallop.     No murmur heard. Pulmonary/Chest: Effort normal and breath sounds normal. No stridor. No respiratory distress. She has no decreased breath sounds. She has no wheezes. She has no rhonchi. She has no rales.  Abdominal: Soft. Normal appearance and bowel sounds are normal. She exhibits no distension. There is no tenderness. There is no rebound and no CVA tenderness.  Musculoskeletal: Normal range of motion. She exhibits no edema and no tenderness.  Neurological: She is alert and oriented to person, place, and time. She has normal strength. No cranial nerve deficit or sensory deficit. GCS eye subscore is 4. GCS verbal subscore is 5. GCS motor subscore is 6.  Skin: Skin is warm and dry. No abrasion and no rash noted.  Psychiatric: She has a normal mood and affect. Her speech is normal and behavior is normal.    ED Course  Procedures (including critical care time)  Labs Reviewed - No data to display No results found.   No diagnosis found.    MDM   Date: 11/07/2011  Rate: 67  Rhythm: normal sinus rhythm  QRS Axis: normal  Intervals: normal  ST/T Wave abnormalities: normal  Conduction Disutrbances:none  Narrative Interpretation:   Old EKG Reviewed: none available   2:56 PM Patient seen by cardiology and will be admitted for observation       Toy Baker, MD 11/07/11 1456

## 2011-11-08 ENCOUNTER — Observation Stay (HOSPITAL_COMMUNITY): Payer: 59

## 2011-11-08 MED ORDER — REGADENOSON 0.4 MG/5ML IV SOLN
INTRAVENOUS | Status: AC
  Filled 2011-11-08: qty 5

## 2011-11-08 MED ORDER — REGADENOSON 0.4 MG/5ML IV SOLN
0.4000 mg | Freq: Once | INTRAVENOUS | Status: AC
  Administered 2011-11-08: 0.4 mg via INTRAVENOUS
  Filled 2011-11-08: qty 5

## 2011-11-08 MED ORDER — TECHNETIUM TC 99M SESTAMIBI - CARDIOLITE
10.0000 | Freq: Once | INTRAVENOUS | Status: AC | PRN
  Administered 2011-11-08: 10 via INTRAVENOUS

## 2011-11-08 MED ORDER — TECHNETIUM TC 99M SESTAMIBI GENERIC - CARDIOLITE
30.0000 | Freq: Once | INTRAVENOUS | Status: AC | PRN
  Administered 2011-11-08: 30 via INTRAVENOUS

## 2011-11-08 MED ORDER — NITROGLYCERIN 0.4 MG SL SUBL: 0.4000 mg | SUBLINGUAL_TABLET | SUBLINGUAL | Status: DC | PRN

## 2011-11-08 NOTE — Progress Notes (Signed)
Subjective:  No chest pain last night  Objective:  Vital Signs in the last 24 hours: Temp:  [97 F (36.1 C)-97.9 F (36.6 C)] 97.6 F (36.4 C) (10/12 0433) Pulse Rate:  [54-69] 54  (10/12 0433) Resp:  [16-20] 20  (10/12 0433) BP: (101-114)/(40-72) 101/40 mmHg (10/12 0433) SpO2:  [98 %-100 %] 98 % (10/12 0433) Weight:  [65.9 kg (145 lb 4.5 oz)] 65.9 kg (145 lb 4.5 oz) (10/11 1635)  Intake/Output from previous day:  Intake/Output Summary (Last 24 hours) at 11/08/11 0829 Last data filed at 11/07/11 2158  Gross per 24 hour  Intake      3 ml  Output      0 ml  Net      3 ml    Physical Exam: General appearance: alert, cooperative and no distress Lungs: clear to auscultation bilaterally Heart: regular rate and rhythm   Rate: 54  Rhythm: sinus bradycardia  Lab Results:  Basename 11/07/11 1624 11/07/11 1033  WBC 4.9 4.9  HGB 12.5 13.0  PLT 258 276    Basename 11/07/11 1033  NA 141  K 3.8  CL 108  CO2 23  GLUCOSE 67*  BUN 19  CREATININE 0.71   No results found for this basename: TROPONINI:2,CK,MB:2 in the last 72 hours Hepatic Function Panel  Basename 11/07/11 1033  PROT 6.9  ALBUMIN 3.7  AST 17  ALT 17  ALKPHOS 101  BILITOT 0.2*  BILIDIR --  IBILI --   No results found for this basename: CHOL in the last 72 hours No results found for this basename: INR in the last 72 hours  Imaging: Imaging results have been reviewed  Cardiac Studies:  Assessment/Plan:   Principal Problem:  *Chest pain, localized Active Problems:  Family history of coronary arteriosclerosis  Migraine headache history  Plan- Myoview today.    Corine Shelter PA-C 11/08/2011, 8:29 AM    I have seen and examined the patient along with Corine Shelter PA-C.  I have reviewed the chart, notes and new data.  I agree with PA's note.  Key new complaints: no further chest pain Key examination changes: none Key new findings / data: ecg and labs remain low risk  PLAN: Review further  evaluation after perfusion scan results.  Thurmon Fair, MD, [facility] [phone] 11/08/2011, 9:04 AM

## 2011-11-08 NOTE — Discharge Summary (Signed)
Patient ID: Miranda Valentine,  MRN: [id number], DOB/AGE: July 16, 1955 56 y.o.  Admit date: 11/07/2011 Discharge date: 11/08/2011  Primary Care Provider: Dr Elvera Lennox. Sanders Primary Cardiologist: Dr Royann Shivers  Discharge Diagnoses Principal Problem:  *Chest pain, localized Active Problems:  Family history of coronary arteriosclerosis  Migraine headache history    Procedures: [facility] Course: Pleasant 56 y/o female followed by Dr R. Sanders. She has no history of CAD, prior chest pain, or previous cardiac work up. She mentioned to her Dtr in-law, who is an Engineer, building services, that she had been having Lt sided chest pain off and on for the past week. There seems to be an exertional component. Her symptoms are more frequent the last 48hrs. She was advised to see her PMD but could not get in and was advised to go to the ER. She has had an ASA but no other medications and is currently pain free. She denies any associated SOB, nausea, or radiation to her jaw, back, or elbows. She was admitted for observation. Troponins were negative. Lexiscan Myoview done 11/08/11, she'll be discharged later today if this is negative. We will be glad to see her on a PRN basis.    Discharge Vitals:  Blood pressure 116/62, pulse 90, temperature 97.6 F (36.4 C), temperature source Oral, resp. rate 20, height 5\' 3"  (1.6 m), weight 65.9 kg (145 lb 4.5 oz), SpO2 98.00%.    Labs: Results for orders placed during the hospital encounter of 11/07/11 (from the past 48 hour(s))  D-DIMER, QUANTITATIVE     Status: Normal   Collection Time   11/07/11 10:33 AM      Component Value Range Comment   D-Dimer, Quant <0.27  0.00 - 0.48 ug/mL-FEU   CBC WITH DIFFERENTIAL     Status: Normal   Collection Time   11/07/11 10:33 AM      Component Value Range Comment   WBC 4.9  4.0 - 10.5 K/uL    RBC 4.48  3.87 - 5.11 MIL/uL    Hemoglobin 13.0  12.0 - 15.0 g/dL    HCT 40.9  81.1 - 91.4 %    MCV 88.6  78.0 - 100.0 fL    MCH 29.0   26.0 - 34.0 pg    MCHC 32.7  30.0 - 36.0 g/dL    RDW 78.2  95.6 - 21.3 %    Platelets 276  150 - 400 K/uL    Neutrophils Relative 54  43 - 77 %    Neutro Abs 2.6  1.7 - 7.7 K/uL    Lymphocytes Relative 37  12 - 46 %    Lymphs Abs 1.8  0.7 - 4.0 K/uL    Monocytes Relative 6  3 - 12 %    Monocytes Absolute 0.3  0.1 - 1.0 K/uL    Eosinophils Relative 3  0 - 5 %    Eosinophils Absolute 0.2  0.0 - 0.7 K/uL    Basophils Relative 0  0 - 1 %    Basophils Absolute 0.0  0.0 - 0.1 K/uL   COMPREHENSIVE METABOLIC PANEL     Status: Abnormal   Collection Time   11/07/11 10:33 AM      Component Value Range Comment   Sodium 141  135 - 145 mEq/L    Potassium 3.8  3.5 - 5.1 mEq/L    Chloride 108  96 - 112 mEq/L    CO2 23  19 - 32 mEq/L    Glucose, Bld  67 (*) 70 - 99 mg/dL    BUN 19  6 - 23 mg/dL    Creatinine, Ser 3.66  0.50 - 1.10 mg/dL    Calcium 9.7  8.4 - 44.0 mg/dL    Total Protein 6.9  6.0 - 8.3 g/dL    Albumin 3.7  3.5 - 5.2 g/dL    AST 17  0 - 37 U/L    ALT 17  0 - 35 U/L    Alkaline Phosphatase 101  39 - 117 U/L    Total Bilirubin 0.2 (*) 0.3 - 1.2 mg/dL    GFR calc non Af Amer >90  >90 mL/min    GFR calc Af Amer >90  >90 mL/min   POCT I-STAT TROPONIN I     Status: Normal   Collection Time   11/07/11 10:35 AM      Component Value Range Comment   Troponin i, poc 0.00  0.00 - 0.08 ng/mL    Comment 3            CBC     Status: Normal   Collection Time   11/07/11  4:24 PM      Component Value Range Comment   WBC 4.9  4.0 - 10.5 K/uL    RBC 4.28  3.87 - 5.11 MIL/uL    Hemoglobin 12.5  12.0 - 15.0 g/dL    HCT 34.7  42.5 - 95.6 %    MCV 87.9  78.0 - 100.0 fL    MCH 29.2  26.0 - 34.0 pg    MCHC 33.2  30.0 - 36.0 g/dL    RDW 38.7  56.4 - 33.2 %    Platelets 258  150 - 400 K/uL     Disposition:  Follow-up Information    Follow up with Thurmon Fair, MD. (call our office for follow up if needed)    Contact information:   [address] [employer] [address] [place]  [address] [phone]       Follow up with Gwynneth Aliment, MD. Call in 2 weeks.   Contact information:   [address] [place] [address] [phone]          Discharge Medications:    Medication List     As of 11/08/2011 10:54 AM    TAKE these medications         ARIPiprazole 2 MG tablet   Commonly known as: ABILIFY   Take 2 mg by mouth daily.      azelastine 137 MCG/SPRAY nasal spray   Commonly known as: ASTELIN   Place 2 sprays into the nose daily. Use in each nostril as directed      cetirizine 10 MG tablet   Commonly known as: ZYRTEC   Take 10 mg by mouth daily.      estradiol 0.5 MG tablet   Commonly known as: ESTRACE   Take 0.5 mg by mouth daily.      mometasone 50 MCG/ACT nasal spray   Commonly known as: NASONEX   Place 2 sprays into the nose daily.      multivitamins ther. w/minerals Tabs   Take 1 tablet by mouth daily.      nitroGLYCERIN 0.4 MG SL tablet   Commonly known as: NITROSTAT   Place 1 tablet (0.4 mg total) under the tongue every 5 (five) minutes x 3 doses as needed for chest pain (if needed for severe chest pain or pressure).      sertraline 100 MG tablet  Commonly known as: ZOLOFT   Take 200 mg by mouth daily.      SYSTANE OP   Place 2 drops into both eyes 2 (two) times daily.      topiramate 100 MG tablet   Commonly known as: TOPAMAX   Take 100 mg by mouth daily.      Vitamin D (Ergocalciferol) 50000 UNITS Caps   Commonly known as: DRISDOL   Take 50,000 Units by mouth every 7 (seven) days. On Sundays         Duration of Discharge Encounter: Greater than 30 minutes including physician time.  Jolene Provost PA-C 11/08/2011 10:54 AM  Normal myocardial perfusion study. Will perform further work-up as outpatient if symptoms do not resolve.

## 2012-03-13 ENCOUNTER — Other Ambulatory Visit: Payer: Self-pay

## 2012-08-30 ENCOUNTER — Other Ambulatory Visit: Payer: Self-pay | Admitting: Obstetrics & Gynecology

## 2012-08-30 DIAGNOSIS — Z1231 Encounter for screening mammogram for malignant neoplasm of breast: Secondary | ICD-10-CM

## 2012-09-09 ENCOUNTER — Ambulatory Visit (HOSPITAL_COMMUNITY)
Admission: RE | Admit: 2012-09-09 | Discharge: 2012-09-09 | Disposition: A | Discharge status: Home / Self Care | Payer: BC Managed Care – PPO | Source: Ambulatory Visit | Attending: Obstetrics & Gynecology | Admitting: Obstetrics & Gynecology

## 2012-09-09 DIAGNOSIS — Z1231 Encounter for screening mammogram for malignant neoplasm of breast: Secondary | ICD-10-CM | POA: Insufficient documentation

## 2012-10-12 ENCOUNTER — Encounter: Payer: Self-pay | Admitting: Obstetrics & Gynecology

## 2012-10-12 ENCOUNTER — Ambulatory Visit: Payer: Self-pay | Admitting: Obstetrics & Gynecology

## 2012-10-19 ENCOUNTER — Other Ambulatory Visit: Payer: Self-pay | Admitting: Obstetrics & Gynecology

## 2012-10-20 NOTE — Telephone Encounter (Signed)
eScribe request for refill on VITAMIN D 45409 weekly Last filled - 09/25/10 per paper chart.  No other refills seen. Last AEX - 08/26/11 Next AEX - 12/07/12 Last Vitamin D check 09/05/10 = 57. Please advise refills.  Paper chart in basket.

## 2012-11-23 ENCOUNTER — Telehealth: Payer: Self-pay | Admitting: Obstetrics & Gynecology

## 2012-11-23 ENCOUNTER — Encounter: Payer: Self-pay | Admitting: Obstetrics & Gynecology

## 2012-11-23 NOTE — Telephone Encounter (Signed)
Pt is requesting a refill for premarin pt is using [facility] @ [address]

## 2012-11-24 MED ORDER — ESTROGENS CONJUGATED 0.625 MG PO TABS: 0.6250 mg | ORAL_TABLET | Freq: Every day | ORAL | Status: DC

## 2012-11-24 NOTE — Telephone Encounter (Signed)
Pt states she is taking Premarin.  RX sent.

## 2012-11-24 NOTE — Telephone Encounter (Signed)
Patient returning [name]'s call.

## 2012-11-24 NOTE — Telephone Encounter (Signed)
Faxed refill request received from [facility] pharmacy for PREMARIN Last filled by MD on 09/24/11, # 90 X 1 YEAR Last AEX - 09/24/11 Next AEX - 01/04/13 Mammogram - 09/13/12, negative Pt was also given RX for Estradiol to try.  Pt was taking this in 10/2011.   Message left for pt to return call to make sure she is taking Premarin not Estradiol.

## 2012-12-02 ENCOUNTER — Other Ambulatory Visit: Payer: Self-pay

## 2012-12-07 ENCOUNTER — Ambulatory Visit: Payer: Self-pay | Admitting: Obstetrics & Gynecology

## 2013-01-03 ENCOUNTER — Encounter: Payer: Self-pay | Admitting: Obstetrics & Gynecology

## 2013-01-04 ENCOUNTER — Encounter: Payer: Self-pay | Admitting: Obstetrics & Gynecology

## 2013-01-04 ENCOUNTER — Ambulatory Visit (INDEPENDENT_AMBULATORY_CARE_PROVIDER_SITE_OTHER): Payer: BC Managed Care – PPO | Admitting: Obstetrics & Gynecology

## 2013-01-04 VITALS — BP 102/68 | HR 60 | Resp 12 | Ht 62.25 in | Wt 151.2 lb

## 2013-01-04 DIAGNOSIS — Z0000 Encounter for general adult medical examination without abnormal findings: Secondary | ICD-10-CM

## 2013-01-04 DIAGNOSIS — Z01419 Encounter for gynecological examination (general) (routine) without abnormal findings: Secondary | ICD-10-CM

## 2013-01-04 LAB — HEMOGLOBIN, FINGERSTICK: Hemoglobin, fingerstick: 12.7 g/dL (ref 12.0–16.0)

## 2013-01-04 LAB — POCT URINALYSIS DIPSTICK
Bilirubin, UA: NEGATIVE
Blood, UA: NEGATIVE
Glucose, UA: NEGATIVE
Ketones, UA: NEGATIVE
Nitrite, UA: NEGATIVE
Protein, UA: NEGATIVE
Urobilinogen, UA: NEGATIVE
pH, UA: 7

## 2013-01-04 MED ORDER — ESTROGENS CONJUGATED 0.45 MG PO TABS: 0.4500 mg | ORAL_TABLET | Freq: Every day | ORAL | Status: DC

## 2013-01-04 MED ORDER — VITAMIN D (ERGOCALCIFEROL) 1.25 MG (50000 UNIT) PO CAPS: 50000.0000 [IU] | ORAL_CAPSULE | ORAL | Status: DC

## 2013-01-04 NOTE — Patient Instructions (Signed)

## 2013-01-04 NOTE — Progress Notes (Signed)
57 y.o. G1P1 MarriedCaucasianF here for annual exam.  Tried Estradiol last year.  She felt her mood was different but there was lots of "other stuff" going on at the same time.  Went on abilify at the same time.  Switched back to Premarin but pharmacy requested higher dosage for her.  She was on 0.45mg  so will go back to this dose after today.   Father died this year.  Had Parkinson's disease so she traveled a lot in the last year.    Patient's last menstrual period was 01/28/1984.          Sexually active: yes  The current method of family planning is status post hysterectomy.    Exercising: yes  walking and strength training Smoker:  no  Health Maintenance: Pap:  06/28/07 WNL History of abnormal Pap:  yes MMG:  09/09/12 normal Colonoscopy:  2007 repeat in 10 years, Dr. Loreta Ave BMD:   06/2011 stable TDaP:  2008 Screening Labs: 2012 and 2013--CMP/TSH/Vit D/Cholesterol/CBC, Hb today: 12.7, Urine today: WBC-trace, PH-7   reports that she has never smoked. She has never used smokeless tobacco. She reports that she drinks about 2.5 ounces of alcohol per week. She reports that she does not use illicit drugs.  Past Medical History  Diagnosis Date  . Depression   . Anginal pain 11/07/2011  . Migraines   . Asthma   . Osteopenia   . Positive TB test     skin test  . Ovarian cyst     benign    Past Surgical History  Procedure Laterality Date  . Abdominal hysterectomy  1982    TAH/RSO  . Tonsillectomy and adenoidectomy  1997  . Lso  1981    Current Outpatient Prescriptions  Medication Sig Dispense Refill  . ARIPiprazole (ABILIFY) 2 MG tablet Take 2 mg by mouth daily.      Marland Kitchen azelastine (ASTELIN) 137 MCG/SPRAY nasal spray Place 2 sprays into the nose daily. Use in each nostril as directed      . cetirizine (ZYRTEC) 10 MG tablet Take 10 mg by mouth daily.      Marland Kitchen estrogens, conjugated, (PREMARIN) 0.625 MG tablet Take 1 tablet (0.625 mg total) by mouth daily. Take daily for 21 days then do  not take for 7 days.  30 tablet  2  . fluticasone (FLONASE) 50 MCG/ACT nasal spray       . Multiple Vitamins-Minerals (MULTIVITAMINS THER. W/MINERALS) TABS Take 1 tablet by mouth daily.      Bertram Gala Glycol-Propyl Glycol (SYSTANE OP) Place 2 drops into both eyes 2 (two) times daily.      . sertraline (ZOLOFT) 100 MG tablet Take 200 mg by mouth daily.      Marland Kitchen topiramate (TOPAMAX) 100 MG tablet Take 100 mg by mouth daily.      . Vitamin D, Ergocalciferol, (DRISDOL) 50000 UNITS CAPS capsule TAKE 1 CAPSULE BY MOUTH ONCE A WEEK  12 capsule  0   No current facility-administered medications for this visit.    Family History  Problem Relation Age of Onset  . Cancer Mother     unclear primary-mets  . Hypertension Father   . CVA Maternal Grandmother   . CVA Maternal Grandfather   . CVA Paternal Grandfather   . Parkinson's disease Maternal Grandfather   . Migraines Sister     ROS:  Pertinent items are noted in HPI.  Otherwise, a comprehensive ROS was negative.  Exam:   BP 102/68  Pulse 60  Resp 12  Ht 5' 2.25" (1.581 m)  Wt 151 lb 3.2 oz (68.584 kg)  BMI 27.44 kg/m2  LMP 01/28/1984  Weight change:+5lb   Height: 5' 2.25" (158.1 cm)  Ht Readings from Last 3 Encounters:  01/04/13 5' 2.25" (1.581 m)  11/07/11 5\' 3"  (1.6 m)    General appearance: alert, cooperative and appears stated age Head: Normocephalic, without obvious abnormality, atraumatic Neck: no adenopathy, supple, symmetrical, trachea midline and thyroid normal to inspection and palpation Lungs: clear to auscultation bilaterally Breasts: normal appearance, no masses or tenderness Heart: regular rate and rhythm Abdomen: soft, non-tender; bowel sounds normal; no masses,  no organomegaly Extremities: extremities normal, atraumatic, no cyanosis or edema Skin: Skin color, texture, turgor normal. No rashes or lesions Lymph nodes: Cervical, supraclavicular, and axillary nodes normal. No abnormal inguinal nodes  palpated Neurologic: Grossly normal   Pelvic: External genitalia:  no lesions              Urethra:  normal appearing urethra with no masses, tenderness or lesions              Bartholins and Skenes: normal                 Vagina: normal appearing vagina with normal color and discharge, no lesions              Cervix: absent              Pap taken: no Bimanual Exam:  Uterus:  uterus absent              Adnexa: no mass, fullness, tenderness               Rectovaginal: Confirms               Anus:  normal sphincter tone, no lesions  A:  Well Woman with normal exam H/O TAH/RSO, later LSO, on HRT Worsening depression this year with father's death Osteopenia, stable BMD 18-Jul-2022 Chronic migraines--on Topamax H/O GERD, ER visit 10/14 due to chest pain.  eval neg.  P:   Mammogram yearly.  D/w pt benefit of 3D MMG for her.  Grade 1 breast density pap smear not indicated Premarin 0.45mg  daily.  Rx to pharmacy Rx for Vit D 50,000 IU weekly.  #12/4RF.  Will repeat labs next year. return annually or prn  An After Visit Summary was printed and given to the patient.

## 2013-08-23 ENCOUNTER — Other Ambulatory Visit: Payer: Self-pay | Admitting: Obstetrics & Gynecology

## 2013-08-23 DIAGNOSIS — Z1231 Encounter for screening mammogram for malignant neoplasm of breast: Secondary | ICD-10-CM

## 2013-09-12 ENCOUNTER — Ambulatory Visit (HOSPITAL_COMMUNITY)
Admission: RE | Admit: 2013-09-12 | Discharge: 2013-09-12 | Disposition: A | Discharge status: Home / Self Care | Payer: BC Managed Care – PPO | Source: Ambulatory Visit | Attending: Obstetrics & Gynecology | Admitting: Obstetrics & Gynecology

## 2013-09-12 DIAGNOSIS — Z1231 Encounter for screening mammogram for malignant neoplasm of breast: Secondary | ICD-10-CM | POA: Diagnosis not present

## 2013-09-15 ENCOUNTER — Encounter: Payer: Self-pay | Admitting: Cardiovascular Disease

## 2013-09-15 ENCOUNTER — Ambulatory Visit (INDEPENDENT_AMBULATORY_CARE_PROVIDER_SITE_OTHER): Payer: BC Managed Care – PPO | Admitting: Cardiovascular Disease

## 2013-09-15 VITALS — BP 118/64 | HR 67 | Resp 16 | Ht 63.0 in | Wt 153.7 lb

## 2013-09-15 DIAGNOSIS — R55 Syncope and collapse: Secondary | ICD-10-CM

## 2013-09-15 NOTE — Patient Instructions (Signed)
Dr. Royann Shiversroitoru recommends that you schedule a follow-up appointment in:  2 MONTHS with orthostatic blood pressures.  INCREASE the sodium in your diet along with increasing your fluid intake.

## 2013-09-17 ENCOUNTER — Encounter: Payer: Self-pay | Admitting: Cardiovascular Disease

## 2013-09-17 DIAGNOSIS — R55 Syncope and collapse: Secondary | ICD-10-CM | POA: Insufficient documentation

## 2013-09-17 NOTE — Assessment & Plan Note (Signed)
Although orthostatic hypotension could not be readily demonstrated on her physical exam today, her history is highly compatible with this diagnosis. It is noteworthy that her heart rate increased well over 20 beats per minute when she change from sitting to standing position. She does not take any medications that could be expected to cause orthostatic hypotension. Her father had Parkinson's disease and serious problems with orthostatic hypotension, but she has no evidence of any neurological problems. Initially, I would recommend that she greatly increase her fluid intake, stay always very well-hydrated and also increase her dietary sodium. We'll try to reassess symptoms in 1-2 months. Have asked her to keep a detailed diary of her events. I think we could easily demonstrate a problem with a tilt table test, but I'm not sure that this would change management. Further interventions could include compression stockings, fludrocortisone or Midodrine. We'll try the simplest and safest interventions first

## 2013-09-17 NOTE — Progress Notes (Signed)
Patient ID: Miranda Valentine, female   DOB: 09/24/1955, 58 y.o.   MRN: [id number]      Reason for office visit Near-syncope  Miranda Valentine is an otherwise healthy 58 year old woman referred for recurrent near syncope. She has occasional migraines and nasal allergies. She does not have any known cardiovascular problems. Once or twice a week over the last several months she has experienced severe dizziness. This always happens when she stands from a sitting position, especially when she gets out of the car. It only happens if she's been sitting down for a long period of time. She does not lost consciousness completely but develops tunnel vision and dizziness. Often the episodes of near syncope will be followed by her typical migraine aura and the headache. She does not describe true vertigo, nausea or vomiting. She has not had chest pain, dyspnea, lower extremity edema or palpitations.   No Known Allergies  Current Outpatient Prescriptions  Medication Sig Dispense Refill  . azelastine (ASTELIN) 137 MCG/SPRAY nasal spray Place 2 sprays into the nose daily. Use in each nostril as directed      . cetirizine (ZYRTEC) 10 MG tablet Take 10 mg by mouth daily.      Marland Kitchen estrogens, conjugated, (PREMARIN) 0.45 MG tablet Take 1 tablet (0.45 mg total) by mouth daily. Take daily for 21 days then do not take for 7 days.  30 tablet  12  . fluticasone (FLONASE) 50 MCG/ACT nasal spray       . Multiple Vitamins-Minerals (MULTIVITAMINS THER. W/MINERALS) TABS Take 1 tablet by mouth daily.       No current facility-administered medications for this visit.    Past Medical History  Diagnosis Date  . Depression   . Anginal pain 11/07/2011  . Migraines   . Asthma   . Osteopenia   . Positive TB test     skin test  . Ovarian cyst     benign    Past Surgical History  Procedure Laterality Date  . Abdominal hysterectomy  1982    TAH/RSO  . Tonsillectomy and adenoidectomy  1997  . Lso  1981    Family History    Problem Relation Age of Onset  . Cancer Mother     unclear primary-mets  . Hypertension Father   . CVA Maternal Grandmother   . CVA Maternal Grandfather   . CVA Paternal Grandfather   . Parkinson's disease Maternal Grandfather   . Migraines Sister    father also had Parkinson's disease and orthostatic hypotension  History   Social History  . Marital Status: Married    Spouse Name: N/A    Number of Children: N/A  . Years of Education: N/A   Occupational History  . Not on file.   Social History Main Topics  . Smoking status: Never Smoker   . Smokeless tobacco: Never Used  . Alcohol Use: 2.5 oz/week    5 drink(s) per week  . Drug Use: No  . Sexual Activity: Yes    Partners: Male    Birth Control/ Protection: Surgical     Comment: TAH/BSO   Other Topics Concern  . Not on file   Social History Narrative  . No narrative on file    Review of systems: The patient specifically denies any chest pain at rest or with exertion, dyspnea at rest or with exertion, orthopnea, paroxysmal nocturnal dyspnea, syncope, palpitations, focal neurological deficits, intermittent claudication, lower extremity edema, unexplained weight gain, cough, hemoptysis or wheezing.  The patient  also denies abdominal pain, nausea, vomiting, dysphagia, diarrhea, constipation, polyuria, polydipsia, dysuria, hematuria, frequency, urgency, abnormal bleeding or bruising, fever, chills, unexpected weight changes, mood swings, change in skin or hair texture, change in voice quality, auditory or visual problems, allergic reactions or rashes, new musculoskeletal complaints other than usual "aches and pains".   PHYSICAL EXAM BP 118/64  Pulse 67  Resp 16  Ht  (1.6 m)  Wt 153 lb 11.2 oz (69.718 kg)  BMI 27.23 kg/m2  LMP 01/28/1984 Supine blood pressure 116/70 mm Hg, heart rate 60 Sitting blood pressure 116/70 mm Hg, heart rate 66 Standing blood pressure immediately 111/66 mm Hg, heart rate 88. After 5  minutes blood pressure 120/66, heart rate 80 General: Alert, oriented x3, no distress Head: no evidence of trauma, PERRL, EOMI, no exophtalmos or lid lag, no myxedema, no xanthelasma; normal ears, nose and oropharynx Neck: normal jugular venous pulsations and no hepatojugular reflux; brisk carotid pulses without delay and no carotid bruits Chest: clear to auscultation, no signs of consolidation by percussion or palpation, normal fremitus, symmetrical and full respiratory excursions Cardiovascular: normal position and quality of the apical impulse, regular rhythm, normal first and second heart sounds, no murmurs, rubs or gallops Abdomen: no tenderness or distention, no masses by palpation, no abnormal pulsatility or arterial bruits, normal bowel sounds, no hepatosplenomegaly Extremities: no clubbing, cyanosis or edema; 2+ radial, ulnar and brachial pulses bilaterally; 2+ right femoral, posterior tibial and dorsalis pedis pulses; 2+ left femoral, posterior tibial and dorsalis pedis pulses; no subclavian or femoral bruits Neurological: grossly nonfocal   EKG: Normal sinus rhythm, normal tracing  Lipid Panel  No results found for this basename: chol, trig, hdl, cholhdl, vldl, ldlcalc    BMET    Component Value Date/Time   NA 141 11/07/2011 1033   K 3.8 11/07/2011 1033   CL 108 11/07/2011 1033   CO2 23 11/07/2011 1033   GLUCOSE 67* 11/07/2011 1033   BUN 19 11/07/2011 1033   CREATININE 0.71 11/07/2011 1033   CALCIUM 9.7 11/07/2011 1033   GFRNONAA >90 11/07/2011 1033   GFRAA >90 11/07/2011 1033   labs June 25 hemoglobin 12.8, normal CBC, glucose 103 (nonfasting), creatinine 0.7, sodium 137, potassium 4.0, normal liver function tests   ASSESSMENT AND PLAN Near syncope Although orthostatic hypotension could not be readily demonstrated on her physical exam today, her history is highly compatible with this diagnosis. It is noteworthy that her heart rate increased well over 20 beats per minute  when she change from sitting to standing position. She does not take any medications that could be expected to cause orthostatic hypotension. Her father had Parkinson's disease and serious problems with orthostatic hypotension, but she has no evidence of any neurological problems. Initially, I would recommend that she greatly increase her fluid intake, stay always very well-hydrated and also increase her dietary sodium. We'll try to reassess symptoms in 1-2 months. Have asked her to keep a detailed diary of her events. I think we could easily demonstrate a problem with a tilt table test, but I'm not sure that this would change management. Further interventions could include compression stockings, fludrocortisone or Midodrine. We'll try the simplest and safest interventions first   Patient Instructions  Dr. Royann Shivers recommends that you schedule a follow-up appointment in:  2 MONTHS with orthostatic blood pressures.  INCREASE the sodium in your diet along with increasing your fluid intake.       Junious Silk, MD, [facility] [phone] office (  [phone] pager

## 2013-10-04 IMAGING — CR DG CHEST 2V
2 series · 2 of 2 positions shown · non-contrast
Comparison: None.

CLINICAL DATA: Pain.

CHEST - 2 VIEW

[w chest pa]
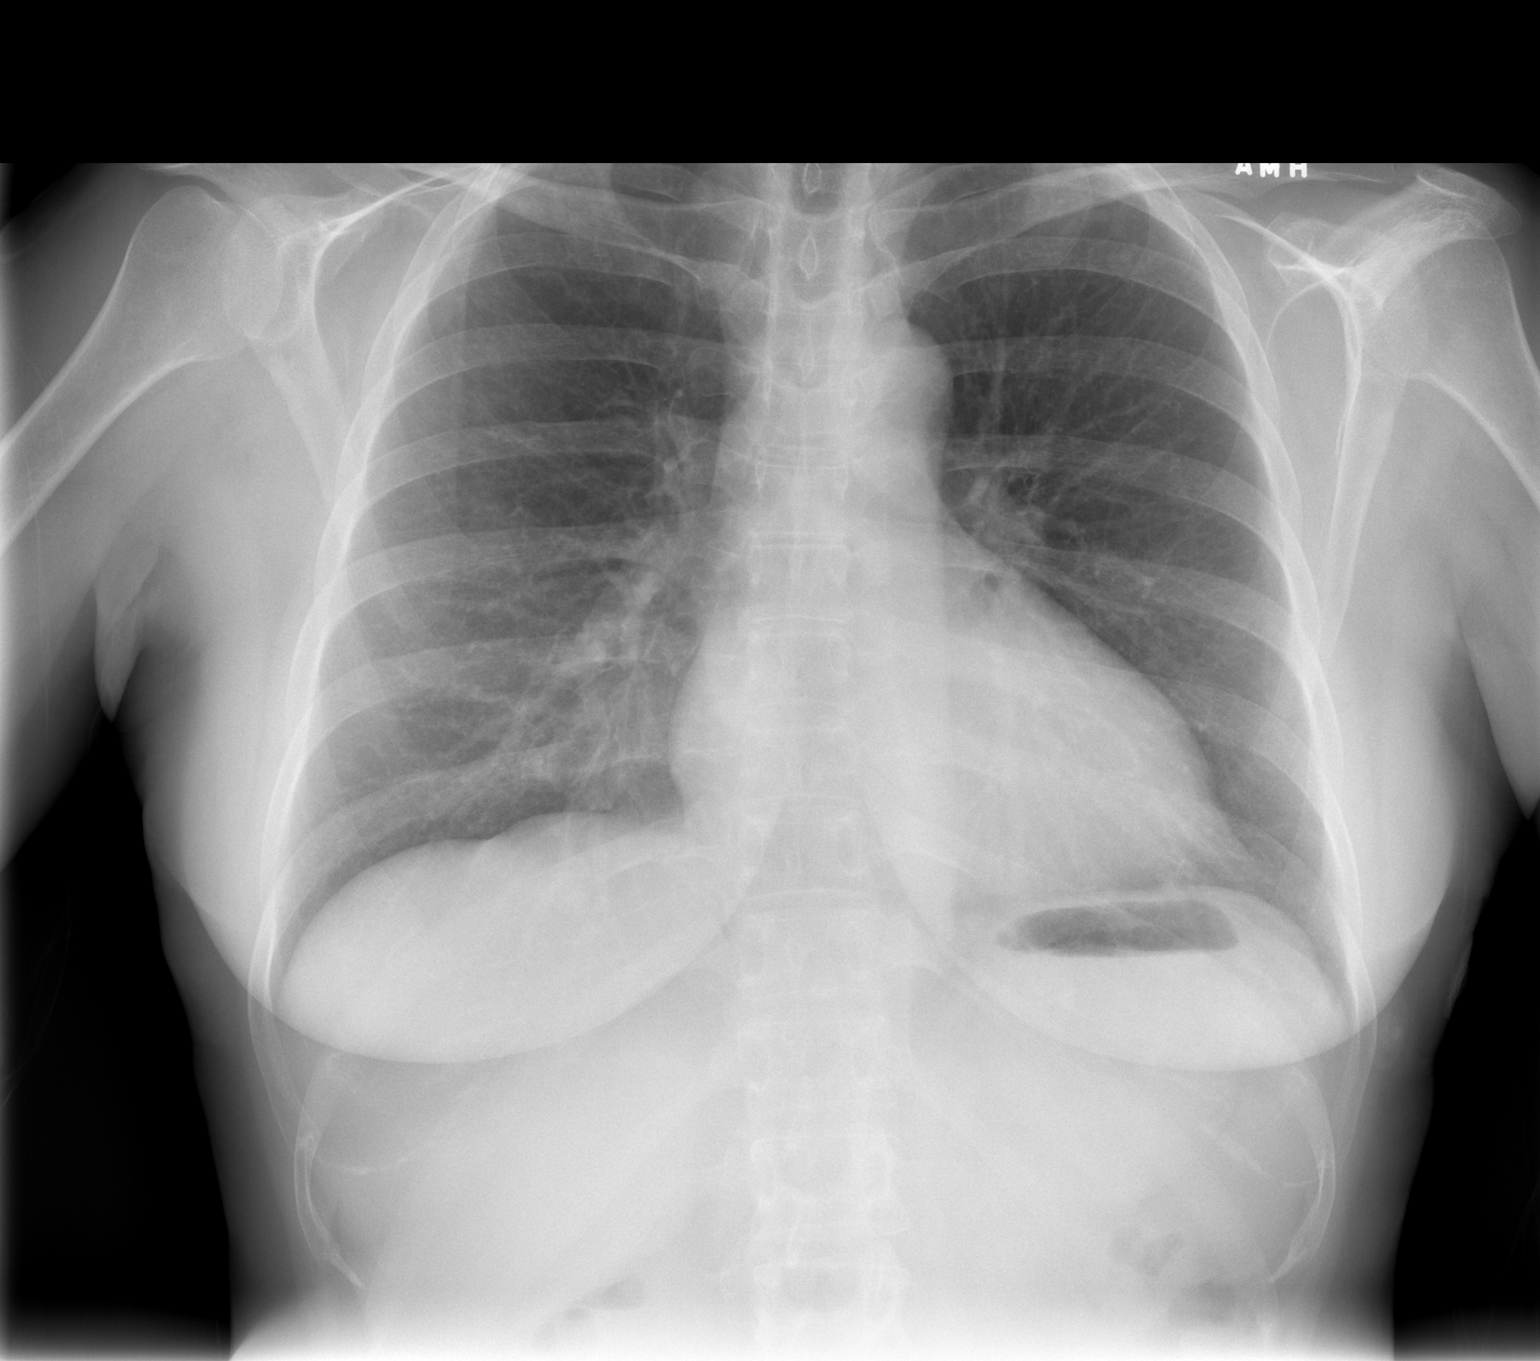

[w chest lat]
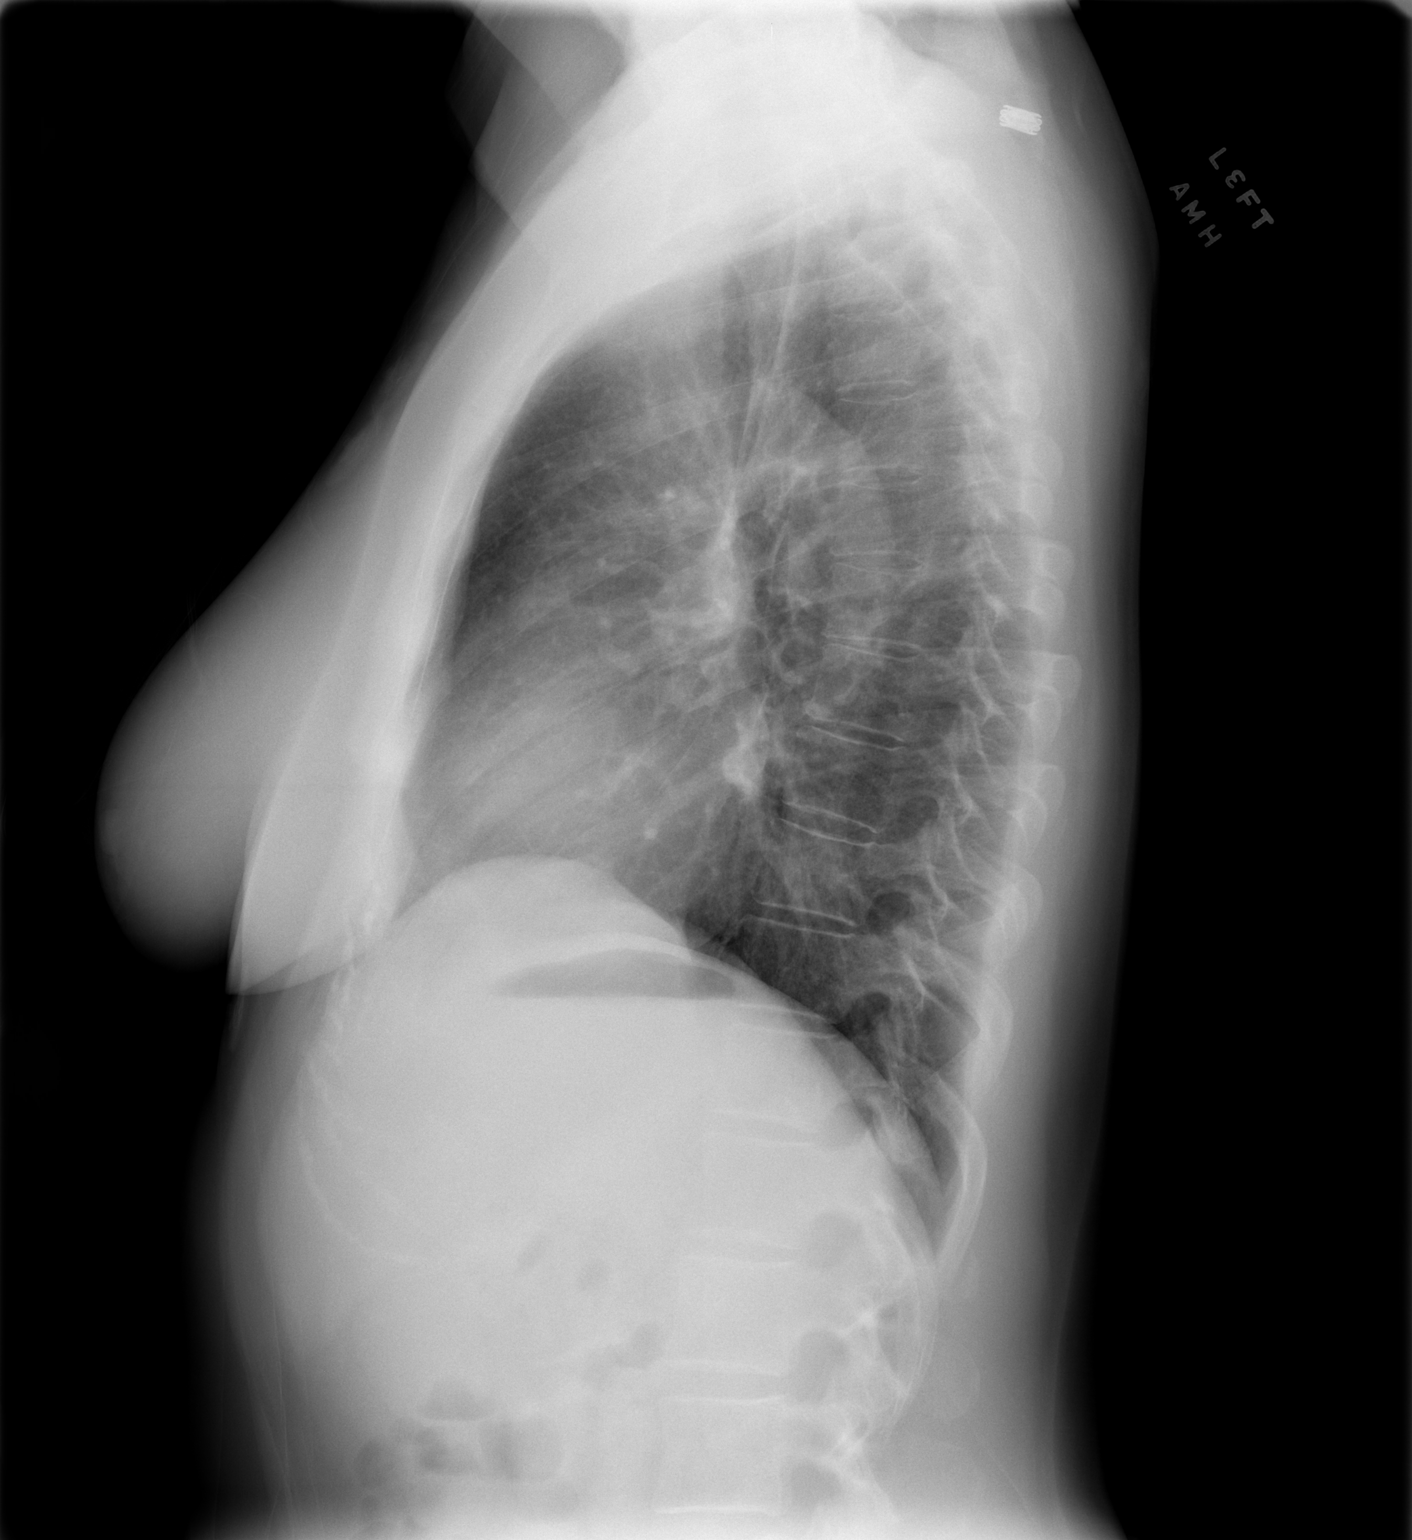

[2 of 2 positions shown; findings below may reference images not displayed]

FINDINGS: Heart and mediastinal contours are within normal limits.
No focal opacities or effusions.  No acute bony abnormality.
IMPRESSION: No active cardiopulmonary disease.

## 2013-11-21 ENCOUNTER — Ambulatory Visit: Payer: BC Managed Care – PPO | Admitting: Cardiovascular Disease

## 2013-11-28 ENCOUNTER — Encounter: Payer: Self-pay | Admitting: Cardiovascular Disease

## 2014-01-23 ENCOUNTER — Other Ambulatory Visit: Payer: Self-pay

## 2014-01-23 MED ORDER — VITAMIN D (ERGOCALCIFEROL) 1.25 MG (50000 UNIT) PO CAPS: 50000.0000 [IU] | ORAL_CAPSULE | ORAL | Status: DC

## 2014-01-23 NOTE — Telephone Encounter (Signed)
Medication refill request: Vitamin D 50,000 units Last AEX:  01/04/13 Next AEX: 02/12/14 Last MMG (if hormonal medication request): NA Refill authorized: until AEX

## 2014-01-30 ENCOUNTER — Other Ambulatory Visit: Payer: Self-pay | Admitting: *Deleted

## 2014-01-30 MED ORDER — ESTROGENS CONJUGATED 0.45 MG PO TABS: 0.4500 mg | ORAL_TABLET | Freq: Every day | ORAL | Status: DC

## 2014-01-30 NOTE — Telephone Encounter (Signed)
Medication refill request: Premarin  Last AEX:  01/04/13 Next AEX: 02/02/14 Last MMG (if hormonal medication request): 08/2013 BIRADS1:neg Refill authorized: 01/04/13 #30/12R. Today #30/0R

## 2014-02-02 ENCOUNTER — Encounter: Payer: Self-pay | Admitting: Obstetrics & Gynecology

## 2014-02-02 ENCOUNTER — Ambulatory Visit (INDEPENDENT_AMBULATORY_CARE_PROVIDER_SITE_OTHER): Payer: BLUE CROSS/BLUE SHIELD | Admitting: Obstetrics & Gynecology

## 2014-02-02 VITALS — BP 120/70 | HR 74 | Resp 18 | Ht 62.5 in | Wt 151.0 lb

## 2014-02-02 DIAGNOSIS — Z01419 Encounter for gynecological examination (general) (routine) without abnormal findings: Secondary | ICD-10-CM

## 2014-02-02 DIAGNOSIS — Z0000 Encounter for general adult medical examination without abnormal findings: Secondary | ICD-10-CM

## 2014-02-02 LAB — POCT URINALYSIS DIPSTICK
Leukocytes, UA: NEGATIVE
Urobilinogen, UA: NEGATIVE
pH, UA: 5

## 2014-02-02 LAB — LIPID PANEL
Cholesterol: 212 mg/dL — ABNORMAL HIGH (ref 0–200)
HDL: 101 mg/dL (ref >39)
LDL Cholesterol: 86 mg/dL (ref 0–99)
Total CHOL/HDL Ratio: 2.1 Ratio
Triglycerides: 124 mg/dL (ref <150)
VLDL: 25 mg/dL (ref 0–40)

## 2014-02-02 LAB — TSH: TSH: 0.803 u[IU]/mL (ref 0.350–4.500)

## 2014-02-02 LAB — COMPREHENSIVE METABOLIC PANEL
ALT: 18 U/L (ref 0–35)
AST: 21 U/L (ref 0–37)
Albumin: 4.2 g/dL (ref 3.5–5.2)
Alkaline Phosphatase: 80 U/L (ref 39–117)
BUN: 10 mg/dL (ref 6–23)
CO2: 28 mEq/L (ref 19–32)
Calcium: 9.5 mg/dL (ref 8.4–10.5)
Chloride: 101 mEq/L (ref 96–112)
Creat: 0.81 mg/dL (ref 0.50–1.10)
Glucose, Bld: 87 mg/dL (ref 70–99)
Potassium: 4.1 mEq/L (ref 3.5–5.3)
Sodium: 138 mEq/L (ref 135–145)
Total Bilirubin: 0.5 mg/dL (ref 0.2–1.2)
Total Protein: 6.8 g/dL (ref 6.0–8.3)

## 2014-02-02 MED ORDER — ESTROGENS CONJUGATED 0.45 MG PO TABS: 0.4500 mg | ORAL_TABLET | Freq: Every day | ORAL | Status: DC

## 2014-02-02 MED ORDER — VITAMIN D (ERGOCALCIFEROL) 1.25 MG (50000 UNIT) PO CAPS: 50000.0000 [IU] | ORAL_CAPSULE | ORAL | Status: DC

## 2014-02-02 NOTE — Progress Notes (Signed)
59 y.o. G1P1 MarriedCaucasianF here for annual exam.  Doing well.  Going to [place] within in the next month to visit family.  Saw a cardiologist this past year due to orthostatic hypotension.  Pt reports things are improved some.  She hasn't had any follow-up due to this reason despite recommendation for 2 months follow up.  No vaginal bleeding.    PCP:  Dr. Katrinka Blazing   Patient's last menstrual period was 01/28/1984.          Sexually active: Yes.    The current method of family planning is status post hysterectomy.    Exercising: Yes.    walking,weights 3x,1x/wk Smoker:  no  Health Maintenance: Pap:  06/28/2007 Negative  History of abnormal Pap:  no MMG:  09/13/13 Bi-Rads 1: Negative  Colonoscopy:  06/2005- polyps f/u in 2017 BMD:   07/25/11, -1.1/-1.9 TDaP:  2008  Screening Labs: yes, Hb today: 12.5 , Urine today: Tiny Trace RBC's   reports that she has never smoked. She has never used smokeless tobacco. She reports that she drinks about 3.0 oz of alcohol per week. She reports that she does not use illicit drugs.  Past Medical History  Diagnosis Date  . Depression   . Anginal pain 11/07/2011  . Migraines   . Asthma   . Osteopenia   . Positive TB test     skin test  . Ovarian cyst     benign    Past Surgical History  Procedure Laterality Date  . Abdominal hysterectomy  1982    TAH/RSO  . Tonsillectomy and adenoidectomy  1997  . Lso  1981    Current Outpatient Prescriptions  Medication Sig Dispense Refill  . azelastine (ASTELIN) 137 MCG/SPRAY nasal spray Place 2 sprays into the nose daily. Use in each nostril as directed    . cetirizine (ZYRTEC) 10 MG tablet Take 10 mg by mouth daily.    . fluticasone (FLONASE) 50 MCG/ACT nasal spray     . Multiple Vitamins-Minerals (MULTIVITAMINS THER. W/MINERALS) TABS Take 1 tablet by mouth daily.    . Vitamin D, Ergocalciferol, (DRISDOL) 50000 UNITS CAPS capsule Take 1 capsule (50,000 Units total) by mouth every 7 (seven) days. 12 capsule  0  . estrogens, conjugated, (PREMARIN) 0.45 MG tablet Take 1 tablet (0.45 mg total) by mouth daily. 30 tablet 0  . PRISTIQ 50 MG 24 hr tablet daily.  0   No current facility-administered medications for this visit.    Family History  Problem Relation Age of Onset  . Cancer Mother     unclear primary-mets  . Hypertension Father   . CVA Maternal Grandmother   . CVA Maternal Grandfather   . CVA Paternal Grandfather   . Parkinson's disease Maternal Grandfather   . Migraines Sister     ROS:  Pertinent items are noted in HPI.  Otherwise, a comprehensive ROS was negative.  Exam:   BP 120/70 mmHg  Pulse 74  Resp 18  Ht 5' 2.5" (1.588 m)  Wt 151 lb (68.493 kg)  BMI 27.16 kg/m2  LMP 01/28/1984   Height: 5' 2.5" (158.8 cm)  Ht Readings from Last 3 Encounters:  02/02/14 5' 2.5" (1.588 m)  09/15/13  (1.6 m)  01/04/13 5' 2.25" (1.581 m)    General appearance: alert, cooperative and appears stated age Head: Normocephalic, without obvious abnormality, atraumatic Neck: no adenopathy, supple, symmetrical, trachea midline and thyroid normal to inspection and palpation Lungs: clear to auscultation bilaterally Breasts: normal appearance, no masses  or tenderness Heart: regular rate and rhythm Abdomen: soft, non-tender; bowel sounds normal; no masses,  no organomegaly Extremities: extremities normal, atraumatic, no cyanosis or edema Skin: Skin color, texture, turgor normal. No rashes or lesions Lymph nodes: Cervical, supraclavicular, and axillary nodes normal. No abnormal inguinal nodes palpated Neurologic: Grossly normal   Pelvic: External genitalia:  no lesions              Urethra:  normal appearing urethra with no masses, tenderness or lesions              Bartholins and Skenes: normal                 Vagina: normal appearing vagina with normal color and discharge, no lesions              Cervix: absent              Pap taken: No. Bimanual Exam:  Uterus:  uterus absent               Adnexa: no mass, fullness, tenderness               Rectovaginal: Confirms               Anus:  normal sphincter tone, no lesions  Chaperone was present for exam.  A:  Well Woman with normal exam H/O TAH/RSO, later LSO, on HRT Osteopenia, stable BMD 6/13 Chronic migraines.  Now off Topamax H/O Depression after father's death.  Dr. Nolen MuMcKinney is psychiatrist.  P: Mammogram yearly.pap smear not indicated Premarin 0.45mg  daily. Rx to pharmacy Rx for Vit D 50,000 IU weekly. #12/4RF. Will repeat labs next year. Sees Dr. Nolen MuMcKinney every six months. return annually or prn

## 2014-02-03 LAB — VITAMIN D 25 HYDROXY (VIT D DEFICIENCY, FRACTURES): Vit D, 25-Hydroxy: 55 ng/mL (ref 30–100)

## 2014-02-03 LAB — HEMOGLOBIN, FINGERSTICK: Hemoglobin, fingerstick: 12.5 g/dL (ref 12.0–16.0)

## 2014-02-13 ENCOUNTER — Telehealth: Payer: Self-pay

## 2014-02-13 NOTE — Telephone Encounter (Signed)
Patient notified see result note 

## 2014-02-13 NOTE — Telephone Encounter (Signed)
Lmtcb//kn 

## 2014-02-13 NOTE — Telephone Encounter (Signed)
-----   Message from Annamaria BootsMary Suzanne Miller, MD sent at 02/13/2014  7:15 AM EST ----- Inform CMP, TSH, Vit D are normal.  Cholesterol is mildly elevated with total 212 but HDLs area really good.  LDLs are normal. TGs normal.  No treatment needed.  Repeat 2-3 years.

## 2014-09-15 ENCOUNTER — Other Ambulatory Visit: Payer: Self-pay | Admitting: Obstetrics & Gynecology

## 2014-09-15 DIAGNOSIS — Z1231 Encounter for screening mammogram for malignant neoplasm of breast: Secondary | ICD-10-CM

## 2014-09-26 ENCOUNTER — Ambulatory Visit (HOSPITAL_COMMUNITY): Payer: BLUE CROSS/BLUE SHIELD

## 2014-10-12 ENCOUNTER — Ambulatory Visit (HOSPITAL_COMMUNITY): Payer: BLUE CROSS/BLUE SHIELD | Attending: Obstetrics & Gynecology

## 2014-10-24 ENCOUNTER — Ambulatory Visit (HOSPITAL_COMMUNITY)
Admission: RE | Admit: 2014-10-24 | Discharge: 2014-10-24 | Disposition: A | Discharge status: Home / Self Care | Payer: BLUE CROSS/BLUE SHIELD | Source: Ambulatory Visit | Attending: Obstetrics & Gynecology | Admitting: Obstetrics & Gynecology

## 2014-10-24 DIAGNOSIS — Z1231 Encounter for screening mammogram for malignant neoplasm of breast: Secondary | ICD-10-CM

## 2015-03-04 ENCOUNTER — Other Ambulatory Visit: Payer: Self-pay | Admitting: Obstetrics & Gynecology

## 2015-03-05 NOTE — Telephone Encounter (Signed)
Medication refill request: Premarin Last AEX:  02-02-14 Next AEX: 04-05-15 Last MMG (if hormonal medication request): 10-26-14 WNL Refill authorized: please advise

## 2015-04-05 ENCOUNTER — Encounter: Payer: Self-pay | Admitting: Obstetrics & Gynecology

## 2015-04-05 ENCOUNTER — Ambulatory Visit (INDEPENDENT_AMBULATORY_CARE_PROVIDER_SITE_OTHER): Payer: BLUE CROSS/BLUE SHIELD | Admitting: Obstetrics & Gynecology

## 2015-04-05 VITALS — BP 122/80 | HR 76 | Resp 16 | Ht 62.25 in | Wt 152.0 lb

## 2015-04-05 DIAGNOSIS — R82998 Other abnormal findings in urine: Secondary | ICD-10-CM

## 2015-04-05 DIAGNOSIS — Z0000 Encounter for general adult medical examination without abnormal findings: Secondary | ICD-10-CM

## 2015-04-05 DIAGNOSIS — M8580 Other specified disorders of bone density and structure, unspecified site: Secondary | ICD-10-CM | POA: Diagnosis not present

## 2015-04-05 DIAGNOSIS — N390 Urinary tract infection, site not specified: Secondary | ICD-10-CM

## 2015-04-05 DIAGNOSIS — L659 Nonscarring hair loss, unspecified: Secondary | ICD-10-CM | POA: Diagnosis not present

## 2015-04-05 DIAGNOSIS — Z01419 Encounter for gynecological examination (general) (routine) without abnormal findings: Secondary | ICD-10-CM

## 2015-04-05 DIAGNOSIS — E559 Vitamin D deficiency, unspecified: Secondary | ICD-10-CM

## 2015-04-05 LAB — POCT URINALYSIS DIPSTICK
Bilirubin, UA: NEGATIVE
Glucose, UA: NEGATIVE
Ketones, UA: NEGATIVE
Nitrite, UA: NEGATIVE
Protein, UA: NEGATIVE
Urobilinogen, UA: NEGATIVE
pH, UA: 6

## 2015-04-05 LAB — TSH: TSH: 0.96 mIU/L

## 2015-04-05 MED ORDER — ZOSTER VACCINE LIVE 19400 UNT/0.65ML ~~LOC~~ SOLR: 0.6500 mL | Freq: Once | SUBCUTANEOUS | Status: DC

## 2015-04-05 MED ORDER — VITAMIN D (ERGOCALCIFEROL) 1.25 MG (50000 UNIT) PO CAPS: 50000.0000 [IU] | ORAL_CAPSULE | ORAL | Status: DC

## 2015-04-05 NOTE — Patient Instructions (Addendum)
Plan Bone Density with your next mammogram.  I've already put in the order for this with the breast center.    Go get the shingles vaccines at [facility].

## 2015-04-05 NOTE — Progress Notes (Signed)
60 y.o. G1P1 MarriedCaucasianF here for annual exam.  No vaginal bleeding.   Complains of increased hair loss this year.  Is in a new job this past year and she does have increased stressors.  She has had hair loss in the past due to stress.   D/W pt the Zostavax.  Pt did have chicken pox.  Order will be placed.    Stopped Premarin last month.  Having some minor hot flashes.    PCP:  Dr. Katrinka BlazingSmith.   Hasn't seen her in several years.    Patient's last menstrual period was 01/28/1984.          Sexually active: Yes.    The current method of family planning is status post hysterectomy.    Exercising: Yes.    Weights Smoker:  no  Health Maintenance: Pap:  06/28/2007 Neg History of abnormal Pap:  no MMG:  10/26/14 BIRADS1:neg Colonoscopy:  06/2005 polyps, Due later this year.  Pt aware.  Saw Dr. Loreta AveMann.  BMD:   07/25/11 -1.1/-1.9 TDaP:  2008  Screening Labs: Here, Hb today: 12.9, Urine today: RBC=Tace, WBC=Moderate.    reports that she has never smoked. She has never used smokeless tobacco. She reports that she drinks about 3.0 oz of alcohol per week. She reports that she does not use illicit drugs.  Past Medical History  Diagnosis Date  . Depression   . Anginal pain (HCC) 11/07/2011  . Migraines   . Asthma   . Osteopenia   . Positive TB test     skin test  . Ovarian cyst     benign    Past Surgical History  Procedure Laterality Date  . Abdominal hysterectomy  1982    TAH/RSO  . Tonsillectomy and adenoidectomy  1997  . Lso  1981    Family History  Problem Relation Age of Onset  . Cancer Mother     unclear primary-mets  . Hypertension Father   . CVA Maternal Grandmother   . CVA Maternal Grandfather   . CVA Paternal Grandfather   . Parkinson's disease Maternal Grandfather   . Migraines Sister     ROS:  Pertinent items are noted in HPI.  Otherwise, a comprehensive ROS was negative.  Exam:   BP 122/80 mmHg  Pulse 76  Resp 16  Ht 5' 2.25" (1.581 m)  Wt 152 lb (68.947 kg)   BMI 27.58 kg/m2  LMP 01/28/1984  Weight change: +1#   Height: 5' 2.25" (158.1 cm)  Ht Readings from Last 3 Encounters:  04/05/15 5' 2.25" (1.581 m)  02/02/14 5' 2.5" (1.588 m)  09/15/13 5\' 3"  (1.6 m)    General appearance: alert, cooperative and appears stated age Head: Normocephalic, without obvious abnormality, atraumatic Neck: no adenopathy, supple, symmetrical, trachea midline and thyroid normal to inspection and palpation Lungs: clear to auscultation bilaterally Breasts: normal appearance, no masses or tenderness Heart: regular rate and rhythm Abdomen: soft, non-tender; bowel sounds normal; no masses,  no organomegaly Extremities: extremities normal, atraumatic, no cyanosis or edema Skin: Skin color, texture, turgor normal. No rashes or lesions Lymph nodes: Cervical, supraclavicular, and axillary nodes normal. No abnormal inguinal nodes palpated Neurologic: Grossly normal   Pelvic: External genitalia:  no lesions              Urethra:  normal appearing urethra with no masses, tenderness or lesions              Bartholins and Skenes: normal  Vagina: normal appearing vagina with normal color and discharge, no lesions              Cervix: absent              Pap taken: No. Bimanual Exam:  Uterus:  uterus absent              Adnexa: no mass, fullness, tenderness               Rectovaginal: Confirms               Anus:  normal sphincter tone, no lesions  Chaperone was present for exam.  A:  Well Woman with normal exam H/O TAH/RSO, later LSO, on HRT Osteopenia, stable BMD 6/13.  Plan repeat this year, especially with pt stopping premarin a month ago.   Chronic migraines. Now off Topamax H/O Depression after father's death. Dr. Nolen Mu is psychiatrist.  Still seeing her regularly.  P: Mammogram yearly.pap smear not indicated Colonoscopy due this year Rx for Vit D 50,000 IU weekly. #12/4RF. Vit D today Testosterone and TSH today Urine micro  and culture pending Order for BMD placed Order for Zostavax placed return annually or prn

## 2015-04-06 ENCOUNTER — Other Ambulatory Visit: Payer: BLUE CROSS/BLUE SHIELD

## 2015-04-06 DIAGNOSIS — L659 Nonscarring hair loss, unspecified: Secondary | ICD-10-CM

## 2015-04-06 LAB — HEMOGLOBIN, FINGERSTICK: Hemoglobin, fingerstick: 12.9 g/dL (ref 12.0–16.0)

## 2015-04-06 LAB — URINALYSIS, MICROSCOPIC ONLY
Bacteria, UA: NONE SEEN [HPF]
Casts: NONE SEEN [LPF]
Crystals: NONE SEEN [HPF]
RBC / HPF: NONE SEEN RBC/HPF (ref <=2)
Squamous Epithelial / LPF: NONE SEEN [HPF] (ref <=5)
Yeast: NONE SEEN [HPF]

## 2015-04-06 LAB — URINE CULTURE
Colony Count: NO GROWTH
Organism ID, Bacteria: NO GROWTH

## 2015-04-06 LAB — VITAMIN D 25 HYDROXY (VIT D DEFICIENCY, FRACTURES): Vit D, 25-Hydroxy: 46 ng/mL (ref 30–100)

## 2015-04-06 LAB — TESTOSTERONE: Testosterone: 26 ng/dL

## 2015-04-17 ENCOUNTER — Telehealth: Payer: Self-pay | Admitting: Obstetrics & Gynecology

## 2015-04-17 MED ORDER — ESTRADIOL 0.5 MG PO TABS: 0.5000 mg | ORAL_TABLET | Freq: Every day | ORAL | Status: DC

## 2015-04-17 NOTE — Telephone Encounter (Signed)
I'd like her to start on Estradiol 0.5mg  daily.  Ok to sent to pharmacy.  She can call and give update in 4 weeks.

## 2015-04-17 NOTE — Telephone Encounter (Signed)
Left message to call Kaitlyn at [phone]. 

## 2015-04-17 NOTE — Telephone Encounter (Signed)
Left message to call Jaquayla Hege at [phone]. 

## 2015-04-17 NOTE — Telephone Encounter (Signed)
Spoke with patient. Patient was last seen in office for her aex 04/05/2015. Reports she spoke with Dr.Miller about her hot flashes at that appointment and was advised to contact the office if she would like to restart HRT. Reports she is experiencing increased hot flashes, night sweats, and is having difficulty sleeping. Patient was previously on Premarin 0.45 mg. States Dr.Miller wants to place her on a lower dosage. Requesting to be on Estradiol due to cost. Advised I will speak with Dr.Miller and return call with further recommendations. She is agreeable.

## 2015-04-17 NOTE — Telephone Encounter (Signed)
Patient would like to discuss her hormone medication and a change with the dose.

## 2015-04-23 NOTE — Telephone Encounter (Signed)
Left message to call Azaiah Mello at [phone]. 

## 2015-04-26 NOTE — Telephone Encounter (Signed)
Left message to call Kaitlyn at [phone]. 

## 2015-04-30 NOTE — Telephone Encounter (Signed)
Can you send a letter that states the prescription was done but I'd like an update?  Thanks.

## 2015-04-30 NOTE — Telephone Encounter (Signed)
I have been unable to reach this patient x3 regarding rx for Estradiol 0.5 mg daily #30 0RF being sent to pharmacy and the need to give an update in 4 weeks. Dr.Miller, is it okay to close this encounter?

## 2015-04-30 NOTE — Telephone Encounter (Signed)
Letter written and sent to patient's home address on file.  Routing to provider for final review. Patient agreeable to disposition. Will close encounter.

## 2015-05-15 ENCOUNTER — Other Ambulatory Visit: Payer: Self-pay | Admitting: Obstetrics & Gynecology

## 2015-05-15 NOTE — Telephone Encounter (Signed)
[facility] pharmacy calling requesting a refill for Estradiol. [facility] [phone].

## 2015-05-15 NOTE — Telephone Encounter (Signed)
Medication refill request: Estradiol  Last AEX:  04-05-15 Next AEX: 07-18-16 Last MMG (if hormonal medication request): 10-25-16 Refill authorized: please advise  Yvonna Alanis(Kaitlyn sent patient a letter on 04-17-15 in regards to this RX.)

## 2015-05-16 MED ORDER — ESTRADIOL 0.5 MG PO TABS: 0.5000 mg | ORAL_TABLET | Freq: Every day | ORAL | Status: DC

## 2015-05-17 ENCOUNTER — Telehealth: Payer: Self-pay | Admitting: Obstetrics & Gynecology

## 2015-05-17 NOTE — Telephone Encounter (Signed)
Left message to call Nicloe Frontera at [phone]. 

## 2015-05-17 NOTE — Telephone Encounter (Signed)
Patient is calling regarding a letter she received from [place].

## 2015-05-17 NOTE — Telephone Encounter (Signed)
Spoke with patient regarding how she is doing with her rx for Estradiol 0.5 mg daily (please see telephone encounter dated 04/17/2015). Patient states that she is doing very well and would like to continue at this dose. Advised patient that Dr.Miller has sent in refills for Estradiol 0.5 mg #30 10RF to her pharmacy on file. She is agreeable.  Routing to provider for final review. Patient agreeable to disposition. Will close encounter.

## 2015-09-28 LAB — HM COLONOSCOPY

## 2015-11-21 ENCOUNTER — Other Ambulatory Visit: Payer: Self-pay | Admitting: Obstetrics & Gynecology

## 2015-11-21 NOTE — Telephone Encounter (Signed)
Medication refill request: Astelin Nasal spray  Last AEX:  04-05-15  Next AEX: 07-18-16 Last MMG (if hormonal medication request): 10-26-14 wnl Refill authorized: please advise

## 2015-11-23 NOTE — Telephone Encounter (Signed)
Please let pt know I did the RF for her Astelin but I don't normally write this for her so please have the pharmacy send the next RF request to the provider who typically writes this for her.  Thanks.

## 2015-11-26 NOTE — Telephone Encounter (Signed)
Spoke with patient and gave Dr. Garen LahMillers message below. Patient voiced understanding -eh

## 2015-11-30 ENCOUNTER — Other Ambulatory Visit: Payer: Self-pay | Admitting: Obstetrics & Gynecology

## 2015-11-30 DIAGNOSIS — Z1231 Encounter for screening mammogram for malignant neoplasm of breast: Secondary | ICD-10-CM

## 2015-12-19 ENCOUNTER — Ambulatory Visit
Admission: RE | Admit: 2015-12-19 | Discharge: 2015-12-19 | Disposition: A | Discharge status: Home / Self Care | Payer: BLUE CROSS/BLUE SHIELD | Source: Ambulatory Visit | Attending: Obstetrics & Gynecology | Admitting: Obstetrics & Gynecology

## 2015-12-19 DIAGNOSIS — M8580 Other specified disorders of bone density and structure, unspecified site: Secondary | ICD-10-CM

## 2015-12-19 DIAGNOSIS — Z1231 Encounter for screening mammogram for malignant neoplasm of breast: Secondary | ICD-10-CM

## 2016-01-19 ENCOUNTER — Other Ambulatory Visit: Payer: Self-pay | Admitting: Obstetrics & Gynecology

## 2016-01-23 NOTE — Telephone Encounter (Signed)
Medication refill request: Astelin nasal spray  Last AEX:  04-05-15  Next AEX: 07-18-16 Last MMG (if hormonal medication request): 12-22-15 WN L Refill authorized: please advise

## 2016-02-07 ENCOUNTER — Other Ambulatory Visit: Payer: Self-pay | Admitting: Obstetrics & Gynecology

## 2016-02-07 NOTE — Telephone Encounter (Signed)
Medication refill request: Astelin nasal spray  Last AEX:  04-05-15  Next AEX: 07-18-16 Last MMG (if hormonal medication request): 12-22-15 WN L Refill authorized: please advise  

## 2016-03-27 ENCOUNTER — Other Ambulatory Visit: Payer: Self-pay | Admitting: Obstetrics & Gynecology

## 2016-03-28 NOTE — Telephone Encounter (Signed)
Medication refill request: Estradiol 0.5mg  Last AEX:  04/05/15 SM  Next AEX: 07/18/16 Last MMG (if hormonal medication request): 12/19/15 BIRADS 1 negative Refill authorized: 05/16/15 #30 w/10 refills; today please advise

## 2016-05-02 ENCOUNTER — Other Ambulatory Visit: Payer: Self-pay | Admitting: Obstetrics & Gynecology

## 2016-05-05 NOTE — Telephone Encounter (Signed)
Medication refill request: Vitamin D  Last AEX:  04-05-15  Next AEX: 07-18-16  Last MMG (if hormonal medication request): 12-19-15 WNL  Refill authorized: please advise

## 2016-07-17 NOTE — Progress Notes (Signed)
61 y.o. G1P1 MarriedCaucasianF here for annual exam.  Doing well. Expecting first grandchildren (twins) in August.  This family lives locally.      Patient's last menstrual period was 01/28/1984.          Sexually active: Yes.    The current method of family planning is status post hysterectomy.    Exercising: Yes.    weights Smoker:  no  Health Maintenance: Pap:  06/2007 Normal  History of abnormal Pap:  no MMG:  12/19/15 BIRADS1:neg  Colonoscopy: 07/28/15 adenomatous polyp. F/u 5 years.  Dr. Loreta AveMann BMD:   12/19/15 Mild osteopenia, -1.6 in hip TDaP:  2008.  Will update today. Pneumonia vaccine(s):  No Zostavax:   Order given for this today Hep C testing: has donated blood.  Not indicated. Screening Labs: Will discuss    reports that she has never smoked. She has never used smokeless tobacco. She reports that she drinks about 3.0 oz of alcohol per week . She reports that she does not use drugs.  Past Medical History:  Diagnosis Date  . Anginal pain (HCC) 11/07/2011  . Asthma   . Depression   . Migraines   . Osteopenia   . Ovarian cyst    benign  . 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 ABDOMINAL HYSTERECTOMY  1982   TAH/RSO  . LSO  1981  . TONSILLECTOMY AND ADENOIDECTOMY  1997    Current Outpatient Prescriptions  Medication Sig Dispense Refill  . azelastine (ASTELIN) 0.1 % nasal spray USE 1 TO 2 SPRAYS IN EACH NOSTRIL TWICE DAILY 30 mL 5  . Biotin 1 MG CAPS Take by mouth daily.    . cetirizine (ZYRTEC) 10 MG tablet Take 10 mg by mouth daily.    Marland Kitchen. estradiol (ESTRACE) 0.5 MG tablet TAKE 1 TABLET(0.5 MG) BY MOUTH DAILY 30 tablet 3  . fluticasone (FLONASE) 50 MCG/ACT nasal spray Place 2 sprays into both nostrils daily.     . magnesium 30 MG tablet Take 30 mg by mouth daily.    . montelukast (SINGULAIR) 10 MG tablet Take 1 tablet by mouth daily.  1  . Multiple Vitamins-Minerals (MULTIVITAMINS THER. W/MINERALS) TABS Take 1 tablet  by mouth daily.    Marland Kitchen. PRISTIQ 50 MG 24 hr tablet Take 50 mg by mouth daily.   0  . Vitamin D, Ergocalciferol, (DRISDOL) 50000 units CAPS capsule TAKE 1 CAPSULE BY MOUTH EVERY 7 DAYS 12 capsule 0  . zaleplon (SONATA) 10 MG capsule Take 1 capsule by mouth as needed.  5   No current facility-administered medications for this visit.     Family History  Problem Relation Age of Onset  . Cancer Mother        unclear primary-mets  . Hypertension Father   . CVA Maternal Grandmother   . CVA Maternal Grandfather   . Parkinson's disease Maternal Grandfather   . CVA Paternal Grandfather   . Migraines Sister     ROS:  Pertinent items are noted in HPI.  Otherwise, a comprehensive ROS was negative.  Exam:   BP 120/80 (BP Location: Right Arm, Patient Position: Sitting, Cuff Size: Normal)   Pulse 86   Resp 18   Ht 5\' 2"  (1.575 m)   Wt 156 lb (70.8 kg)   LMP 01/28/1984   BMI 28.53 kg/m   Weight change:  +4#  Height: 5\' 2"  (157.5 cm)  Ht Readings from Last 3  Encounters:  07/18/16 5\' 2"  (1.575 m)  04/05/15 5' 2.25" (1.581 m)  02/02/14 5' 2.5" (1.588 m)    General appearance: alert, cooperative and appears stated age Head: Normocephalic, without obvious abnormality, atraumatic Neck: no adenopathy, supple, symmetrical, trachea midline and thyroid normal to inspection and palpation Lungs: clear to auscultation bilaterally Breasts: normal appearance, no masses or tenderness Heart: regular rate and rhythm Abdomen: soft, non-tender; bowel sounds normal; no masses,  no organomegaly Extremities: extremities normal, atraumatic, no cyanosis or edema Skin: Skin color, texture, turgor normal. No rashes or lesions Lymph nodes: Cervical, supraclavicular, and axillary nodes normal. No abnormal inguinal nodes palpated Neurologic: Grossly normal   Pelvic: External genitalia:  no lesions              Urethra:  normal appearing urethra with no masses, tenderness or lesions              Bartholins and  Skenes: normal                 Vagina: normal appearing vagina with normal color and discharge, no lesions              Cervix: absent              Pap taken: No. Bimanual Exam:  Uterus:  uterus absent              Adnexa: no mass, fullness, tenderness               Rectovaginal: Confirms               Anus:  normal sphincter tone, no lesions  Chaperone was present for exam.  A:  Well Woman with normal exam H/O TAH/RSO, later LSO, on HRT Osteopenia with BMD 11/17.  Repeat 4-5 years. H/o migraines, much improved H/O depression after father's death, still seeing therapist H/O Vit D deficiency   P:   Mammogram guidelines reviewed pap smear obtained today Orders for Shingrix given tdap updated today Will plan fasting lab work one year Vit D 50K weekly.  #12/4RF Estradiol. 0.5mg  po daily.  #30/13 RF.  Pt is going to cut in 1/2 this year and see how she does.  Reviewed risks. Return annually or prn

## 2016-07-18 ENCOUNTER — Ambulatory Visit (INDEPENDENT_AMBULATORY_CARE_PROVIDER_SITE_OTHER): Payer: BLUE CROSS/BLUE SHIELD | Admitting: Obstetrics & Gynecology

## 2016-07-18 ENCOUNTER — Other Ambulatory Visit (HOSPITAL_COMMUNITY)
Admission: RE | Admit: 2016-07-18 | Discharge: 2016-07-18 | Disposition: A | Discharge status: Home / Self Care | Payer: BLUE CROSS/BLUE SHIELD | Source: Ambulatory Visit | Attending: Obstetrics & Gynecology | Admitting: Obstetrics & Gynecology

## 2016-07-18 ENCOUNTER — Encounter: Payer: Self-pay | Admitting: Obstetrics & Gynecology

## 2016-07-18 VITALS — BP 120/80 | HR 86 | Resp 18 | Ht 62.0 in | Wt 156.0 lb

## 2016-07-18 DIAGNOSIS — Z124 Encounter for screening for malignant neoplasm of cervix: Secondary | ICD-10-CM

## 2016-07-18 DIAGNOSIS — Z01419 Encounter for gynecological examination (general) (routine) without abnormal findings: Secondary | ICD-10-CM

## 2016-07-18 DIAGNOSIS — Z0000 Encounter for general adult medical examination without abnormal findings: Secondary | ICD-10-CM | POA: Diagnosis not present

## 2016-07-18 DIAGNOSIS — Z23 Encounter for immunization: Secondary | ICD-10-CM | POA: Diagnosis not present

## 2016-07-18 MED ORDER — ESTRADIOL 0.5 MG PO TABS: 0.5000 mg | ORAL_TABLET | Freq: Every day | ORAL | 13 refills | Status: DC

## 2016-07-18 MED ORDER — VITAMIN D (ERGOCALCIFEROL) 1.25 MG (50000 UNIT) PO CAPS: 50000.0000 [IU] | ORAL_CAPSULE | ORAL | 4 refills | Status: DC

## 2016-07-22 LAB — CYTOLOGY - PAP: Diagnosis: NEGATIVE

## 2016-12-04 ENCOUNTER — Other Ambulatory Visit: Payer: Self-pay | Admitting: Obstetrics & Gynecology

## 2016-12-04 DIAGNOSIS — Z139 Encounter for screening, unspecified: Secondary | ICD-10-CM

## 2017-01-02 ENCOUNTER — Ambulatory Visit: Payer: BLUE CROSS/BLUE SHIELD

## 2017-01-28 ENCOUNTER — Ambulatory Visit
Admission: RE | Admit: 2017-01-28 | Discharge: 2017-01-28 | Disposition: A | Discharge status: Home / Self Care | Payer: BLUE CROSS/BLUE SHIELD | Source: Ambulatory Visit | Attending: Obstetrics & Gynecology | Admitting: Obstetrics & Gynecology

## 2017-01-28 DIAGNOSIS — Z139 Encounter for screening, unspecified: Secondary | ICD-10-CM

## 2017-07-25 ENCOUNTER — Other Ambulatory Visit: Payer: Self-pay | Admitting: Obstetrics & Gynecology

## 2017-07-27 NOTE — Telephone Encounter (Signed)
Medication refill request: estrodiol .05 mg tab  Last AEX:  07/18/16 Next AEX: 10/08/17 Last MMG (if hormonal medication request): 01/28/17  Bi-rads category 1: Negative. Refill authorized: Medication was last refilled at her aex last year. Please refill or deny if appropriate.  Medication refill request: Vit. D.  50,000 Units  Last AEX:  07/18/16 Next AEX: 10/08/17 Last MMG (if hormonal medication request):  01/28/17  Bi-rads category 1: Negative.  Refill authorized:Medication was last refilled at her aex last year. Please refill or deny if appropriate.

## 2017-08-17 ENCOUNTER — Ambulatory Visit: Payer: PRIVATE HEALTH INSURANCE | Admitting: Psychology

## 2017-08-17 DIAGNOSIS — F4322 Adjustment disorder with anxiety: Secondary | ICD-10-CM

## 2017-08-23 ENCOUNTER — Other Ambulatory Visit: Payer: Self-pay | Admitting: Obstetrics & Gynecology

## 2017-08-24 ENCOUNTER — Ambulatory Visit: Payer: PRIVATE HEALTH INSURANCE | Admitting: Psychology

## 2017-08-24 DIAGNOSIS — F4322 Adjustment disorder with anxiety: Secondary | ICD-10-CM

## 2017-08-24 NOTE — Telephone Encounter (Signed)
Medication refill request:  Estrace 0.5 mg  Last AEX:  07/18/16 Next AEX: 10/08/17 Last MMG (if hormonal medication request): 01/28/17 Bi-rads category 2 neg.  Refill authorized: Please refill until Aex

## 2017-08-31 ENCOUNTER — Ambulatory Visit (INDEPENDENT_AMBULATORY_CARE_PROVIDER_SITE_OTHER): Payer: PRIVATE HEALTH INSURANCE | Admitting: Psychology

## 2017-08-31 DIAGNOSIS — F4322 Adjustment disorder with anxiety: Secondary | ICD-10-CM

## 2017-09-25 ENCOUNTER — Ambulatory Visit: Payer: PRIVATE HEALTH INSURANCE | Admitting: Psychology

## 2017-09-25 DIAGNOSIS — F4322 Adjustment disorder with anxiety: Secondary | ICD-10-CM

## 2017-10-08 ENCOUNTER — Encounter: Payer: Self-pay | Admitting: Obstetrics & Gynecology

## 2017-10-08 ENCOUNTER — Encounter

## 2017-10-08 ENCOUNTER — Ambulatory Visit: Payer: PRIVATE HEALTH INSURANCE | Admitting: Obstetrics & Gynecology

## 2017-10-08 DIAGNOSIS — Z01419 Encounter for gynecological examination (general) (routine) without abnormal findings: Secondary | ICD-10-CM

## 2017-10-08 DIAGNOSIS — Z0000 Encounter for general adult medical examination without abnormal findings: Secondary | ICD-10-CM

## 2017-10-08 MED ORDER — VITAMIN D (ERGOCALCIFEROL) 1.25 MG (50000 UNIT) PO CAPS: ORAL_CAPSULE | ORAL | 4 refills | Status: DC

## 2017-10-08 MED ORDER — ESTRADIOL 0.5 MG PO TABS: ORAL_TABLET | ORAL | 4 refills | Status: DC

## 2017-10-08 NOTE — Progress Notes (Signed)
62 y.o. G1P1 MarriedCaucasianF here for annual exam.  Is fasting today.  Will plan blood work.  Noticing more bruising lately.    Denies vaginal bleeding.    Is noticing a lot more urinary urgency.  Has lost her urine on occasion.  D/w pt Kegel exercises and PT.  Not sure she wants PT right now.    Patient's last menstrual period was 01/28/1984.          Sexually active: Yes.    The current method of family planning is status post hysterectomy.    Exercising: Yes.    walking Smoker:  no  Health Maintenance: Pap:  07/18/16 neg  History of abnormal Pap:  no MMG:  01/28/17 BIRADS1:Neg  Colonoscopy:  09/28/15 polyps. F/u 5 years  BMD:   12/19/15 osteopenia  TDaP:  2018 Pneumonia vaccine(s):  No Shingrix:   No Hep C testing: Has donated blood Screening Labs: fasting labs today    reports that she has never smoked. She has never used smokeless tobacco. She reports that she drinks about 5.0 standard drinks of alcohol per week. She reports that she does not use drugs.  Past Medical History:  Diagnosis Date  . Anginal pain (HCC) 11/07/2011  . Asthma   . Depression   . Migraines   . Osteopenia   . Ovarian cyst    benign  . 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 ABDOMINAL HYSTERECTOMY  1982   TAH/RSO  . LSO  1981  . TONSILLECTOMY AND ADENOIDECTOMY  1997    Current Outpatient Medications  Medication Sig Dispense Refill  . azelastine (ASTELIN) 0.1 % nasal spray USE 1 TO 2 SPRAYS IN EACH NOSTRIL TWICE DAILY 30 mL 5  . Biotin 1 MG CAPS Take by mouth daily.    . cetirizine (ZYRTEC) 10 MG tablet Take 10 mg by mouth daily.    Marland Kitchen. estradiol (ESTRACE) 0.5 MG tablet TAKE 1 TABLET(0.5 MG) BY MOUTH DAILY 90 tablet 0  . fluticasone (FLONASE) 50 MCG/ACT nasal spray Place 2 sprays into both nostrils daily.     . magnesium 30 MG tablet Take 30 mg by mouth daily.    . montelukast (SINGULAIR) 10 MG tablet Take 1 tablet by mouth daily.  1  . Multiple  Vitamins-Minerals (MULTIVITAMINS THER. W/MINERALS) TABS Take 1 tablet by mouth daily.    Marland Kitchen. PRISTIQ 50 MG 24 hr tablet Take 50 mg by mouth daily.   0  . Vitamin D, Ergocalciferol, (DRISDOL) 50000 units CAPS capsule TAKE 1 CAPSULE BY MOUTH EVERY 7 DAYS 12 capsule 0  . zaleplon (SONATA) 10 MG capsule Take 1 capsule by mouth as needed.  5   No current facility-administered medications for this visit.     Family History  Problem Relation Age of Onset  . Cancer Mother        unclear primary-mets  . Hypertension Father   . CVA Maternal Grandmother   . CVA Maternal Grandfather   . Parkinson's disease Maternal Grandfather   . CVA Paternal Grandfather   . Migraines Sister     Review of Systems  Endo/Heme/Allergies: Bruises/bleeds easily.  All other systems reviewed and are negative.   Exam:   BP 128/82 (BP Location: Right Arm, Patient Position: Sitting, Cuff Size: Normal)   Pulse 72   Resp 16   Ht 5' 2.25" (1.581 m)   Wt 156 lb 6.4 oz (70.9 kg)   LMP  01/28/1984   BMI 28.38 kg/m     Height: 5' 2.25" (158.1 cm)  Ht Readings from Last 3 Encounters:  10/08/17 5' 2.25" (1.581 m)  07/18/16 5\' 2"  (1.575 m)  04/05/15 5' 2.25" (1.581 m)    General appearance: alert, cooperative and appears stated age Head: Normocephalic, without obvious abnormality, atraumatic Neck: no adenopathy, supple, symmetrical, trachea midline and thyroid normal to inspection and palpation Lungs: clear to auscultation bilaterally Breasts: normal appearance, no masses or tenderness Heart: regular rate and rhythm Abdomen: soft, non-tender; bowel sounds normal; no masses,  no organomegaly Extremities: extremities normal, atraumatic, no cyanosis or edema Skin: Skin color, texture, turgor normal. No rashes or lesions Lymph nodes: Cervical, supraclavicular, and axillary nodes normal. No abnormal inguinal nodes palpated Neurologic: Grossly normal   Pelvic: External genitalia:  no lesions              Urethra:   normal appearing urethra with no masses, tenderness or lesions              Bartholins and Skenes: normal                 Vagina: normal appearing vagina with normal color and discharge, no lesions              Cervix: absent              Pap taken: No. Bimanual Exam:  Uterus:  uterus absent              Adnexa: no mass, fullness, tenderness               Rectovaginal: Confirms               Anus:  normal sphincter tone, no lesions  Chaperone was present for exam.  A:  Well Woman with normal exam H/O TAH/RSO, later LSO, on HRT Osteopenia with BMD 11/17 H/O migraines  P:   Mammogram guidelines reveiwed.  Doing yearly. pap smear not indicated RF for estrace 0.5mg  daily.  #90/4RF.  Desires to continue. Information about Shingrix vaccination given CBC, CMP, Lipids, TSH, Vit D obtained today Vit D 50K weekly.  #12/4RF BMD, Colonoscopy UTD Return annually or prn

## 2017-10-08 NOTE — Patient Instructions (Signed)
[facility] Phone: [phone] Location: Lower level of [facility] ([address]) Hours: 7:30 a.m. to 6:00 p.m., Monday through Friday.   [facility] Phone: [phone] Location: [address] Hours: 7:30 a.m. to 6:00 p.m., Monday through Friday.  

## 2017-10-09 LAB — COMPREHENSIVE METABOLIC PANEL
ALT: 21 IU/L (ref 0–32)
AST: 26 IU/L (ref 0–40)
Albumin/Globulin Ratio: 1.8 (ref 1.2–2.2)
Albumin: 4.6 g/dL (ref 3.6–4.8)
Alkaline Phosphatase: 111 IU/L (ref 39–117)
BUN/Creatinine Ratio: 16 (ref 12–28)
BUN: 11 mg/dL (ref 8–27)
Bilirubin Total: 0.3 mg/dL (ref 0.0–1.2)
CO2: 25 mmol/L (ref 20–29)
Calcium: 9 mg/dL (ref 8.7–10.3)
Chloride: 98 mmol/L (ref 96–106)
Creatinine, Ser: 0.7 mg/dL (ref 0.57–1.00)
GFR calc Af Amer: 107 mL/min/{1.73_m2} (ref >59)
GFR calc non Af Amer: 93 mL/min/{1.73_m2} (ref >59)
Globulin, Total: 2.6 g/dL (ref 1.5–4.5)
Glucose: 85 mg/dL (ref 65–99)
Potassium: 4.1 mmol/L (ref 3.5–5.2)
Sodium: 140 mmol/L (ref 134–144)
Total Protein: 7.2 g/dL (ref 6.0–8.5)

## 2017-10-09 LAB — LIPID PANEL
Chol/HDL Ratio: 2.6 ratio (ref 0.0–4.4)
Cholesterol, Total: 238 mg/dL — ABNORMAL HIGH (ref 100–199)
HDL: 92 mg/dL (ref >39)
LDL Calculated: 112 mg/dL — ABNORMAL HIGH (ref 0–99)
Triglycerides: 168 mg/dL — ABNORMAL HIGH (ref 0–149)
VLDL Cholesterol Cal: 34 mg/dL (ref 5–40)

## 2017-10-09 LAB — CBC
Hematocrit: 41.2 % (ref 34.0–46.6)
Hemoglobin: 13.3 g/dL (ref 11.1–15.9)
MCH: 29.3 pg (ref 26.6–33.0)
MCHC: 32.3 g/dL (ref 31.5–35.7)
MCV: 91 fL (ref 79–97)
Platelets: 294 10*3/uL (ref 150–450)
RBC: 4.54 x10E6/uL (ref 3.77–5.28)
RDW: 13.6 % (ref 12.3–15.4)
WBC: 4.1 10*3/uL (ref 3.4–10.8)

## 2017-10-09 LAB — VITAMIN D 25 HYDROXY (VIT D DEFICIENCY, FRACTURES): Vit D, 25-Hydroxy: 60.3 ng/mL (ref 30.0–100.0)

## 2017-10-09 LAB — TSH: TSH: 1.59 u[IU]/mL (ref 0.450–4.500)

## 2017-10-15 MED FILL — SHINGRIX 50 MCG SUS: 50 | 1 days supply | Qty: 1 | Fill #0

## 2017-12-30 MED FILL — SHINGRIX 50 MCG SUS: 50 | 1 days supply | Qty: 1 | Fill #1

## 2018-01-08 ENCOUNTER — Other Ambulatory Visit: Payer: Self-pay | Admitting: Obstetrics & Gynecology

## 2018-01-08 DIAGNOSIS — Z1231 Encounter for screening mammogram for malignant neoplasm of breast: Secondary | ICD-10-CM

## 2018-01-22 MED FILL — SHINGRIX 50 MCG SUS: 50 | 1 days supply | Qty: 1 | Fill #1

## 2018-02-15 ENCOUNTER — Ambulatory Visit
Admission: RE | Admit: 2018-02-15 | Discharge: 2018-02-15 | Disposition: A | Discharge status: Home / Self Care | Payer: BLUE CROSS/BLUE SHIELD | Source: Ambulatory Visit | Attending: Obstetrics & Gynecology | Admitting: Obstetrics & Gynecology

## 2018-02-15 DIAGNOSIS — Z1231 Encounter for screening mammogram for malignant neoplasm of breast: Secondary | ICD-10-CM

## 2018-09-30 ENCOUNTER — Other Ambulatory Visit: Payer: Self-pay | Admitting: Family Medicine

## 2018-09-30 ENCOUNTER — Ambulatory Visit
Admission: RE | Admit: 2018-09-30 | Discharge: 2018-09-30 | Disposition: A | Discharge status: Home / Self Care | Payer: BLUE CROSS/BLUE SHIELD | Source: Ambulatory Visit | Attending: Family Medicine | Admitting: Family Medicine

## 2018-09-30 DIAGNOSIS — M7989 Other specified soft tissue disorders: Secondary | ICD-10-CM

## 2018-10-27 ENCOUNTER — Other Ambulatory Visit: Payer: Self-pay | Admitting: Obstetrics & Gynecology

## 2018-10-27 NOTE — Telephone Encounter (Signed)
Medication refill request: Estradiol Last AEX:  10/08/17 SM Next AEX: 02/10/19 Last MMG (if hormonal medication request): 02/15/18 BIRADS 1 negative/density b Refill authorized: Please advise; Order pended #90 w/1 refill if authorized to last patient until AEX.

## 2018-11-10 ENCOUNTER — Other Ambulatory Visit: Payer: Self-pay | Admitting: Physician Assistant

## 2018-11-10 ENCOUNTER — Other Ambulatory Visit: Payer: Self-pay

## 2018-11-10 DIAGNOSIS — R1011 Right upper quadrant pain: Secondary | ICD-10-CM

## 2018-11-11 ENCOUNTER — Encounter (HOSPITAL_COMMUNITY): Payer: Self-pay

## 2018-11-11 ENCOUNTER — Ambulatory Visit
Admission: RE | Admit: 2018-11-11 | Discharge: 2018-11-11 | Disposition: A | Discharge status: Home / Self Care | Payer: BC Managed Care – PPO | Source: Ambulatory Visit | Attending: Physician Assistant | Admitting: Physician Assistant

## 2018-11-11 ENCOUNTER — Observation Stay (HOSPITAL_COMMUNITY)
Admission: EM | Admit: 2018-11-11 | Discharge: 2018-11-13 | Disposition: A | Discharge status: Home / Self Care | Payer: BC Managed Care – PPO | Attending: Surgery | Admitting: Surgery

## 2018-11-11 ENCOUNTER — Other Ambulatory Visit: Payer: Self-pay

## 2018-11-11 DIAGNOSIS — J45909 Unspecified asthma, uncomplicated: Secondary | ICD-10-CM | POA: Insufficient documentation

## 2018-11-11 DIAGNOSIS — R1011 Right upper quadrant pain: Secondary | ICD-10-CM

## 2018-11-11 DIAGNOSIS — Z79890 Hormone replacement therapy (postmenopausal): Secondary | ICD-10-CM | POA: Insufficient documentation

## 2018-11-11 DIAGNOSIS — K819 Cholecystitis, unspecified: Secondary | ICD-10-CM | POA: Diagnosis present

## 2018-11-11 DIAGNOSIS — F329 Major depressive disorder, single episode, unspecified: Secondary | ICD-10-CM | POA: Diagnosis not present

## 2018-11-11 DIAGNOSIS — Z79899 Other long term (current) drug therapy: Secondary | ICD-10-CM | POA: Insufficient documentation

## 2018-11-11 DIAGNOSIS — K8000 Calculus of gallbladder with acute cholecystitis without obstruction: Principal | ICD-10-CM | POA: Insufficient documentation

## 2018-11-11 DIAGNOSIS — Z20828 Contact with and (suspected) exposure to other viral communicable diseases: Secondary | ICD-10-CM | POA: Diagnosis not present

## 2018-11-11 DIAGNOSIS — K82A1 Gangrene of gallbladder in cholecystitis: Secondary | ICD-10-CM | POA: Diagnosis not present

## 2018-11-11 DIAGNOSIS — K810 Acute cholecystitis: Secondary | ICD-10-CM | POA: Diagnosis present

## 2018-11-11 HISTORY — DX: Cholecystitis, unspecified: K81.9

## 2018-11-11 LAB — COMPREHENSIVE METABOLIC PANEL
ALT: 70 U/L — ABNORMAL HIGH (ref 0–44)
AST: 67 U/L — ABNORMAL HIGH (ref 15–41)
Albumin: 4 g/dL (ref 3.5–5.0)
Alkaline Phosphatase: 109 U/L (ref 38–126)
Anion gap: 11 (ref 5–15)
BUN: 14 mg/dL (ref 8–23)
CO2: 23 mmol/L (ref 22–32)
Calcium: 8.8 mg/dL — ABNORMAL LOW (ref 8.9–10.3)
Chloride: 101 mmol/L (ref 98–111)
Creatinine, Ser: 0.8 mg/dL (ref 0.44–1.00)
GFR calc Af Amer: >60 mL/min (ref >60)
GFR calc non Af Amer: >60 mL/min (ref >60)
Glucose, Bld: 115 mg/dL — ABNORMAL HIGH (ref 70–99)
Potassium: 3.8 mmol/L (ref 3.5–5.1)
Sodium: 135 mmol/L (ref 135–145)
Total Bilirubin: 1.1 mg/dL (ref 0.3–1.2)
Total Protein: 7.5 g/dL (ref 6.5–8.1)

## 2018-11-11 LAB — URINALYSIS, ROUTINE W REFLEX MICROSCOPIC
Bilirubin Urine: NEGATIVE
Glucose, UA: NEGATIVE mg/dL
Ketones, ur: 20 mg/dL — AB
Leukocytes,Ua: NEGATIVE
Nitrite: NEGATIVE
Protein, ur: 30 mg/dL — AB
Specific Gravity, Urine: 1.023 (ref 1.005–1.030)
pH: 5 (ref 5.0–8.0)

## 2018-11-11 LAB — CBC
HCT: 39.8 % (ref 36.0–46.0)
Hemoglobin: 12.9 g/dL (ref 12.0–15.0)
MCH: 30.1 pg (ref 26.0–34.0)
MCHC: 32.4 g/dL (ref 30.0–36.0)
MCV: 92.8 fL (ref 80.0–100.0)
Platelets: 279 10*3/uL (ref 150–400)
RBC: 4.29 MIL/uL (ref 3.87–5.11)
RDW: 13 % (ref 11.5–15.5)
WBC: 14.8 10*3/uL — ABNORMAL HIGH (ref 4.0–10.5)
nRBC: 0 % (ref 0.0–0.2)

## 2018-11-11 LAB — SARS CORONAVIRUS 2 BY RT PCR (HOSPITAL ORDER, PERFORMED IN ~~LOC~~ HOSPITAL LAB): SARS Coronavirus 2: NEGATIVE

## 2018-11-11 LAB — LIPASE, BLOOD: Lipase: 21 U/L (ref 11–51)

## 2018-11-11 MED ORDER — PIPERACILLIN-TAZOBACTAM 3.375 G IVPB 30 MIN
3.3750 g | Freq: Once | INTRAVENOUS | Status: AC
  Administered 2018-11-11: 3.375 g via INTRAVENOUS
  Filled 2018-11-11: qty 50

## 2018-11-11 MED ORDER — SODIUM CHLORIDE 0.9% FLUSH
3.0000 mL | Freq: Once | INTRAVENOUS | Status: AC
  Administered 2018-11-11: 3 mL via INTRAVENOUS

## 2018-11-11 MED ORDER — HYDROMORPHONE HCL 1 MG/ML IJ SOLN
1.0000 mg | Freq: Once | INTRAMUSCULAR | Status: AC
  Administered 2018-11-11: 1 mg via INTRAVENOUS
  Filled 2018-11-11: qty 1

## 2018-11-11 MED ORDER — ONDANSETRON HCL 4 MG/2ML IJ SOLN
4.0000 mg | Freq: Once | INTRAMUSCULAR | Status: AC
  Administered 2018-11-11: 4 mg via INTRAVENOUS
  Filled 2018-11-11: qty 2

## 2018-11-11 NOTE — ED Provider Notes (Signed)
[place] DEPT Provider Note   CSN: [id number] Arrival date & time: 11/11/18  1239     History   Chief Complaint Chief Complaint  Patient presents with   pain under rib    HPI Miranda Valentine is a 63 y.o. female.     Patient states she she has been hurting in right upper quadrant since Tuesday.  She had an ultrasound of her gallbladder today which shows cholecystitis  The history is provided by the patient. No language interpreter was used.  Abdominal Pain Pain location:  RUQ Pain quality: aching   Pain radiates to:  Does not radiate Pain severity:  Moderate Onset quality:  Sudden Timing:  Constant Progression:  Worsening Chronicity:  New Context: not alcohol use   Relieved by:  Nothing Associated symptoms: no chest pain, no cough, no diarrhea, no fatigue and no hematuria     Past Medical History:  Diagnosis Date   Anginal pain ([place]) 11/07/2011   Asthma    Cholecystitis    Depression    Migraines    Osteopenia    Ovarian cyst    benign   Positive TB test    skin test    Patient Active Problem List   Diagnosis Date Noted   Near syncope 09/17/2013   Chest pain, localized 11/07/2011   Family history of coronary arteriosclerosis 11/07/2011   Migraine headache history 11/07/2011    Past Surgical History:  Procedure Laterality Date   ABDOMINAL HYSTERECTOMY  1982   TAH/RSO   LSO  1981   TONSILLECTOMY AND ADENOIDECTOMY  1997     OB History    Gravida  1   Para  1   Term      Preterm      AB      Living  1     SAB      TAB      Ectopic      Multiple      Live Births               Home Medications    Prior to Admission medications   Medication Sig Start Date End Date Taking? Authorizing Provider  azelastine (ASTELIN) 0.1 % nasal spray USE 1 TO 2 SPRAYS IN EACH NOSTRIL TWICE DAILY Patient taking differently: Place 1-2 sprays into both nostrils 2 (two) times daily as needed  for rhinitis or allergies.  02/07/16  Yes Megan Salon, MD  Biotin 1 MG CAPS Take by mouth at bedtime.    Yes [provider]  cetirizine (ZYRTEC) 10 MG tablet Take 10 mg by mouth at bedtime.    Yes [provider]  estradiol (ESTRACE) 0.5 MG tablet TAKE 1 TABLET(0.5 MG) BY MOUTH DAILY Patient taking differently: Take 0.5 mg by mouth at bedtime.  10/28/18  Yes Megan Salon, MD  fluticasone [facility]) 50 MCG/ACT nasal spray Place 2 sprays into both nostrils at bedtime.  12/12/12  Yes [provider]  magnesium 30 MG tablet Take 30 mg by mouth at bedtime.    Yes [provider]  montelukast (SINGULAIR) 10 MG tablet Take 1 tablet by mouth at bedtime.  07/13/16  Yes [provider]  Multiple Vitamins-Minerals (MULTIVITAMINS THER. W/MINERALS) TABS Take 1 tablet by mouth at bedtime.    Yes [provider]  PRISTIQ 50 MG 24 hr tablet Take 50 mg by mouth daily.  01/30/14  Yes [provider]  Vitamin D, Ergocalciferol, (DRISDOL) 50000 units  CAPS capsule TAKE 1 CAPSULE BY MOUTH EVERY 7 DAYS Patient taking differently: Take 50,000 Units by mouth every 14 (fourteen) days.  10/08/17  Yes Jerene BearsMiller, Mary S, MD  zaleplon (SONATA) 10 MG capsule Take 1 capsule by mouth as needed. 07/05/16  Yes [provider]    Family History Family History  Problem Relation Age of Onset   Cancer Mother        unclear primary-mets   Hypertension Father    CVA Maternal Grandmother    CVA Maternal Grandfather    Parkinson's disease Maternal Grandfather    CVA Paternal Grandfather    Migraines Sister     Social History Social History   Tobacco Use   Smoking status: Never Smoker   Smokeless tobacco: Never Used  Substance Use Topics   Alcohol use: Yes    Alcohol/week: 5.0 standard drinks    Types: 5 Glasses of wine per week   Drug use: No     Allergies   Patient has no known allergies.   Review of Systems Review of Systems    Constitutional: Negative for appetite change and fatigue.  HENT: Negative for congestion, ear discharge and sinus pressure.   Eyes: Negative for discharge.  Respiratory: Negative for cough.   Cardiovascular: Negative for chest pain.  Gastrointestinal: Positive for abdominal pain. Negative for diarrhea.  Genitourinary: Negative for frequency and hematuria.  Musculoskeletal: Negative for back pain.  Skin: Negative for rash.  Neurological: Negative for seizures and headaches.  Psychiatric/Behavioral: Negative for hallucinations.     Physical Exam Updated Vital Signs BP 114/67 (BP Location: Right Arm)    Pulse 94    Temp 98.3 F (36.8 C) (Oral)    Resp 16    Ht 5\' 3"  (1.6 m)    Wt 69.4 kg    LMP 01/28/1984    SpO2 100%    BMI 27.10 kg/m   Physical Exam Vitals signs and nursing note reviewed.  Constitutional:      Appearance: She is well-developed.  HENT:     Head: Normocephalic.     Nose: Nose normal.  Eyes:     General: No scleral icterus.    Conjunctiva/sclera: Conjunctivae normal.  Neck:     Musculoskeletal: Neck supple.     Thyroid: No thyromegaly.  Cardiovascular:     Rate and Rhythm: Normal rate and regular rhythm.     Heart sounds: No murmur. No friction rub. No gallop.   Pulmonary:     Breath sounds: No stridor. No wheezing or rales.  Chest:     Chest wall: No tenderness.  Abdominal:     General: There is no distension.     Tenderness: There is no abdominal tenderness. There is no rebound.  Musculoskeletal: Normal range of motion.  Lymphadenopathy:     Cervical: No cervical adenopathy.  Skin:    Findings: No erythema or rash.  Neurological:     Mental Status: She is alert and oriented to person, place, and time.     Motor: No abnormal muscle tone.     Coordination: Coordination normal.  Psychiatric:        Behavior: Behavior normal.      ED Treatments / Results  Labs (all labs ordered are listed, but only abnormal results are displayed) Labs Reviewed   COMPREHENSIVE METABOLIC PANEL - Abnormal; Notable for the following components:      Result Value   Glucose, Bld 115 (*)    Calcium 8.8 (*)  AST 67 (*)    ALT 70 (*)    All other components within normal limits  CBC - Abnormal; Notable for the following components:   WBC 14.8 (*)    All other components within normal limits  URINALYSIS, ROUTINE W REFLEX MICROSCOPIC - Abnormal; Notable for the following components:   Color, Urine AMBER (*)    APPearance HAZY (*)    Hgb urine dipstick SMALL (*)    Ketones, ur 20 (*)    Protein, ur 30 (*)    Bacteria, UA RARE (*)    Non Squamous Epithelial 0-5 (*)    All other components within normal limits  URINE CULTURE  SARS CORONAVIRUS 2 BY RT PCR (HOSPITAL ORDER, PERFORMED IN [facility])  LIPASE, BLOOD    EKG None  Radiology US Abdomen Limited Ruq  Result Date: 11/11/2018 CLINICAL DATA:  Right upper quadrant pain EXAM: ULTRASOUND ABDOMEN LIMITED RIGHT UPPER QUADRANT COMPARISON:  None. FINDINGS: Gallbladder: Within the gallbladder, there are multiple echogenic foci which move and shadow consistent with cholelithiasis. Largest gallstone measures 1.3 cm in length. Several gallstones are apparent in the neck of the gallbladder. The gallbladder wall is thickened with mild pericholecystic fluid and edema. The patient is tender over the gallbladder. Patient is tender over the gallbladder. Common bile duct: Diameter: 4 mm. There is questionable slight intrahepatic biliary duct dilatation. No common bile duct dilatation evident. Liver: No focal lesion identified. Liver echogenicity is overall increased. Portal vein is patent on color Doppler imaging with normal direction of blood flow towards the liver. Other: There is trace ascites. IMPRESSION: 1. Findings indicative of acute cholecystitis. Specifically, there are gallstones with gallbladder wall thickening and edema. There is slight pericholecystic fluid. Patient is focally tender over  the gallbladder. 2. Common bile duct appears normal. Equivocal intrahepatic biliary duct dilatation. 3. Increased liver echogenicity, a finding indicative of hepatic steatosis. No focal liver lesions are demonstrable. 4.  Trace ascites. These results will be called to the ordering clinician or representative by the Radiologist Assistant, and communication documented in the PACS or zVision Dashboard. Electronically Signed   By: Bretta Bang III M.D.   On: 11/11/2018 10:20    Procedures Procedures (including critical care time)  Medications Ordered in ED Medications  sodium chloride flush (NS) 0.9 % injection 3 mL (has no administration in time range)  piperacillin-tazobactam (ZOSYN) IVPB 3.375 g (has no administration in time range)  HYDROmorphone (DILAUDID) injection 1 mg (has no administration in time range)  ondansetron (ZOFRAN) injection 4 mg (has no administration in time range)     Initial Impression / Assessment and Plan / ED Course  I have reviewed the triage vital signs and the nursing notes.  Pertinent labs & imaging results that were available during my care of the patient were reviewed by me and considered in my medical decision making (see chart for details).       Patient with cholecystitis.  General surgery to see the patient and remove her gallbladder tomorrow  Final Clinical Impressions(s) / ED Diagnoses   Final diagnoses:  Cholecystitis    ED Discharge Orders    None       Bethann Berkshire, MD 11/11/18 1931

## 2018-11-11 NOTE — H&P (Signed)
Miranda Valentine is an 63 y.o. female.   Chief Complaint: RUQ pain HPI: 63 y.o. F with RUQ pain since Tues.  Contacted her PCP and RUQ [place] was performed.  This show GB wall thickening and pericholecystic fluid.  WBC elevated as well.  PSH sig for open hysterectomy.    Past Medical History:  Diagnosis Date  . Anginal pain ([place]) 11/07/2011  . Asthma   . Cholecystitis   . Depression   . Migraines   . Osteopenia   . Ovarian cyst    benign  . 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 ABDOMINAL HYSTERECTOMY  1982   TAH/RSO  . LSO  1981  . TONSILLECTOMY AND ADENOIDECTOMY  1997    Family History  Problem Relation Age of Onset  . Cancer Mother        unclear primary-mets  . Hypertension Father   . CVA Maternal Grandmother   . CVA Maternal Grandfather   . Parkinson's disease Maternal Grandfather   . CVA Paternal Grandfather   . Migraines Sister    Social History:  reports that she has never smoked. She has never used smokeless tobacco. She reports current alcohol use of about 5.0 standard drinks of alcohol per week. She reports that she does not use drugs.  Allergies: No Known Allergies  (Not in a hospital admission)   Results for orders placed or performed during the hospital encounter of 11/11/18 (from the past 48 hour(s))  Lipase, blood     Status: None   Collection Time: 11/11/18  1:11 PM  Result Value Ref Range   Lipase 21 11 - 51 U/L    Comment: Performed at [facility], [place] [address], [place], [place] [address]  Comprehensive metabolic panel     Status: Abnormal   Collection Time: 11/11/18  1:11 PM  Result Value Ref Range   Sodium 135 135 - 145 mmol/L   Potassium 3.8 3.5 - 5.1 mmol/L   Chloride 101 98 - 111 mmol/L   CO2 23 22 - 32 mmol/L   Glucose, Bld 115 (H) 70 - 99 mg/dL   BUN 14 8 - 23 mg/dL   Creatinine, Ser 0.80 0.44 - 1.00 mg/dL   Calcium 8.8 (L) 8.9 - 10.3 mg/dL   Total Protein 7.5  6.5 - 8.1 g/dL   Albumin 4.0 3.5 - 5.0 g/dL   AST 67 (H) 15 - 41 U/L   ALT 70 (H) 0 - 44 U/L   Alkaline Phosphatase 109 38 - 126 U/L   Total Bilirubin 1.1 0.3 - 1.2 mg/dL   GFR calc non Af Amer >60 >60 mL/min   GFR calc Af Amer >60 >60 mL/min   Anion gap 11 5 - 15    Comment: Performed at [facility], [place] [address] [place], [place] [address]  CBC     Status: Abnormal   Collection Time: 11/11/18  1:11 PM  Result Value Ref Range   WBC 14.8 (H) 4.0 - 10.5 K/uL   RBC 4.29 3.87 - 5.11 MIL/uL   Hemoglobin 12.9 12.0 - 15.0 g/dL   HCT 39.8 36.0 - 46.0 %   MCV 92.8 80.0 - 100.0 fL   MCH 30.1 26.0 - 34.0 pg   MCHC 32.4 30.0 - 36.0 g/dL   RDW 13.0 11.5 - 15.5 %   Platelets 279 150 - 400 K/uL   nRBC 0.0 0.0 -  0.2 %    Comment: Performed at [facility], [address] [place], [place] [address]  Urinalysis, Routine w reflex microscopic     Status: Abnormal   Collection Time: 11/11/18  5:55 PM  Result Value Ref Range   Color, Urine AMBER (A) YELLOW    Comment: BIOCHEMICALS MAY BE AFFECTED BY COLOR   APPearance HAZY (A) CLEAR   Specific Gravity, Urine 1.023 1.005 - 1.030   pH 5.0 5.0 - 8.0   Glucose, UA NEGATIVE NEGATIVE mg/dL   Hgb urine dipstick SMALL (A) NEGATIVE   Bilirubin Urine NEGATIVE NEGATIVE   Ketones, ur 20 (A) NEGATIVE mg/dL   Protein, ur 30 (A) NEGATIVE mg/dL   Nitrite NEGATIVE NEGATIVE   Leukocytes,Ua NEGATIVE NEGATIVE   RBC / HPF 0-5 0 - 5 RBC/hpf   Bacteria, UA RARE (A) NONE SEEN   Squamous Epithelial / LPF 6-10 0 - 5   Mucus PRESENT    Hyaline Casts, UA PRESENT    Non Squamous Epithelial 0-5 (A) NONE SEEN    Comment: Performed at [facility], [address] [place], [place] [address]   [place] Abdomen Limited Ruq  Result Date: 11/11/2018 CLINICAL DATA:  Right upper quadrant pain EXAM: ULTRASOUND ABDOMEN LIMITED RIGHT UPPER QUADRANT COMPARISON:  None. FINDINGS: Gallbladder: Within the gallbladder, there are  multiple echogenic foci which move and shadow consistent with cholelithiasis. Largest gallstone measures 1.3 cm in length. Several gallstones are apparent in the neck of the gallbladder. The gallbladder wall is thickened with mild pericholecystic fluid and edema. The patient is tender over the gallbladder. Patient is tender over the gallbladder. Common bile duct: Diameter: 4 mm. There is questionable slight intrahepatic biliary duct dilatation. No common bile duct dilatation evident. Liver: No focal lesion identified. Liver echogenicity is overall increased. Portal vein is patent on color Doppler imaging with normal direction of blood flow towards the liver. Other: There is trace ascites. IMPRESSION: 1. Findings indicative of acute cholecystitis. Specifically, there are gallstones with gallbladder wall thickening and edema. There is slight pericholecystic fluid. Patient is focally tender over the gallbladder. 2. Common bile duct appears normal. Equivocal intrahepatic biliary duct dilatation. 3. Increased liver echogenicity, a finding indicative of hepatic steatosis. No focal liver lesions are demonstrable. 4.  Trace ascites. These results will be called to the ordering clinician or representative by the Radiologist Assistant, and communication documented in the PACS or zVision Dashboard. Electronically Signed   By: Bretta BangWilliam  Woodruff III M.D.   On: 11/11/2018 10:20    Review of Systems  Constitutional: Negative for chills, fever and weight loss.  HENT: Negative for hearing loss and tinnitus.   Eyes: Negative for blurred vision and double vision.  Respiratory: Negative for cough and shortness of breath.   Cardiovascular: Negative for chest pain and palpitations.  Gastrointestinal: Positive for abdominal pain. Negative for nausea and vomiting.  Genitourinary: Negative for dysuria, frequency and urgency.  Musculoskeletal: Negative for myalgias.  Skin: Negative for itching and rash.  Neurological: Negative  for dizziness and headaches.    Blood pressure (!) 126/56, pulse 90, temperature 98.3 F (36.8 C), temperature source Oral, resp. rate 17, height 5\' 3"  (1.6 m), weight 69.4 kg, last menstrual period 01/28/1984, SpO2 99 %. Physical Exam  Constitutional: She is oriented to person, place, and time. She appears well-developed and well-nourished. No distress.  HENT:  Head: Normocephalic and atraumatic.  Eyes: Pupils are equal, round, and reactive to light. Conjunctivae and EOM are normal.  Neck: Normal range of motion. Neck supple.  Cardiovascular: Normal rate and regular rhythm.  Respiratory: Effort normal. No respiratory distress.  GI: Soft. She exhibits no distension. There is abdominal tenderness (RUQ).  Musculoskeletal: Normal range of motion.  Neurological: She is alert and oriented to person, place, and time.  Skin: Skin is warm and dry.     Assessment/Plan 63 y.o. F with acute cholecystitis.  Admit to floor.  Start IV abx.  NPO p MN.  Plan for OR tom.    Vanita Panda, MD 11/11/2018, 8:21 PM

## 2018-11-11 NOTE — ED Notes (Signed)
PT MADE AWARE OF BED STATUS.

## 2018-11-11 NOTE — ED Triage Notes (Signed)
Patient reports that she has been having pain under her right rib area x 2 days. Patient had an [place] today which she stated showed cholecystitis and gallstones. Patient states she was told to come to the ED.

## 2018-11-12 ENCOUNTER — Observation Stay (HOSPITAL_COMMUNITY): Payer: BC Managed Care – PPO | Admitting: Anesthesiology

## 2018-11-12 ENCOUNTER — Encounter (HOSPITAL_COMMUNITY)
Admission: EM | Disposition: A | Discharge status: Home / Self Care | Payer: Self-pay | Source: Home / Self Care | Attending: Emergency Medicine

## 2018-11-12 ENCOUNTER — Encounter (HOSPITAL_COMMUNITY): Payer: Self-pay | Admitting: *Deleted

## 2018-11-12 HISTORY — PX: CHOLECYSTECTOMY: SHX55

## 2018-11-12 LAB — CBC
HCT: 35.7 % — ABNORMAL LOW (ref 36.0–46.0)
Hemoglobin: 11.7 g/dL — ABNORMAL LOW (ref 12.0–15.0)
MCH: 30.5 pg (ref 26.0–34.0)
MCHC: 32.8 g/dL (ref 30.0–36.0)
MCV: 93 fL (ref 80.0–100.0)
Platelets: 255 10*3/uL (ref 150–400)
RBC: 3.84 MIL/uL — ABNORMAL LOW (ref 3.87–5.11)
RDW: 13.1 % (ref 11.5–15.5)
WBC: 12.2 10*3/uL — ABNORMAL HIGH (ref 4.0–10.5)
nRBC: 0 % (ref 0.0–0.2)

## 2018-11-12 LAB — COMPREHENSIVE METABOLIC PANEL
ALT: 60 U/L — ABNORMAL HIGH (ref 0–44)
AST: 65 U/L — ABNORMAL HIGH (ref 15–41)
Albumin: 3.2 g/dL — ABNORMAL LOW (ref 3.5–5.0)
Alkaline Phosphatase: 144 U/L — ABNORMAL HIGH (ref 38–126)
Anion gap: 13 (ref 5–15)
BUN: 15 mg/dL (ref 8–23)
CO2: 20 mmol/L — ABNORMAL LOW (ref 22–32)
Calcium: 8.4 mg/dL — ABNORMAL LOW (ref 8.9–10.3)
Chloride: 101 mmol/L (ref 98–111)
Creatinine, Ser: 0.76 mg/dL (ref 0.44–1.00)
GFR calc Af Amer: >60 mL/min (ref >60)
GFR calc non Af Amer: >60 mL/min (ref >60)
Glucose, Bld: 109 mg/dL — ABNORMAL HIGH (ref 70–99)
Potassium: 4.4 mmol/L (ref 3.5–5.1)
Sodium: 134 mmol/L — ABNORMAL LOW (ref 135–145)
Total Bilirubin: 1 mg/dL (ref 0.3–1.2)
Total Protein: 6.9 g/dL (ref 6.5–8.1)

## 2018-11-12 LAB — URINE CULTURE: Culture: <10000 — AB

## 2018-11-12 LAB — HIV ANTIBODY (ROUTINE TESTING W REFLEX): HIV Screen 4th Generation wRfx: NONREACTIVE

## 2018-11-12 SURGERY — LAPAROSCOPIC CHOLECYSTECTOMY WITH INTRAOPERATIVE CHOLANGIOGRAM
Anesthesia: General | Site: Abdomen

## 2018-11-12 MED ORDER — LACTATED RINGERS IV SOLN
INTRAVENOUS | Status: AC | PRN
  Administered 2018-11-12: 1000 mL

## 2018-11-12 MED ORDER — HYDROMORPHONE HCL 1 MG/ML IJ SOLN
0.2500 mg | INTRAMUSCULAR | Status: DC | PRN
  Administered 2018-11-12 (×4): 0.5 mg via INTRAVENOUS

## 2018-11-12 MED ORDER — HYDROMORPHONE HCL 1 MG/ML IJ SOLN
0.2500 mg | INTRAMUSCULAR | Status: DC | PRN
  Administered 2018-11-12: 0.5 mg via INTRAVENOUS

## 2018-11-12 MED ORDER — DEXAMETHASONE SODIUM PHOSPHATE 10 MG/ML IJ SOLN
INTRAMUSCULAR | Status: DC | PRN
  Administered 2018-11-12: 10 mg via INTRAVENOUS

## 2018-11-12 MED ORDER — FENTANYL CITRATE (PF) 100 MCG/2ML IJ SOLN
INTRAMUSCULAR | Status: AC
  Filled 2018-11-12: qty 2

## 2018-11-12 MED ORDER — BUPIVACAINE HCL (PF) 0.25 % IJ SOLN
INTRAMUSCULAR | Status: DC | PRN
  Administered 2018-11-12: 25 mL

## 2018-11-12 MED ORDER — OXYCODONE HCL 5 MG PO TABS: 5.0000 mg | ORAL_TABLET | Freq: Once | ORAL | Status: DC | PRN

## 2018-11-12 MED ORDER — PROPOFOL 10 MG/ML IV BOLUS
INTRAVENOUS | Status: DC | PRN
  Administered 2018-11-12: 110 mg via INTRAVENOUS

## 2018-11-12 MED ORDER — HYDROCODONE-ACETAMINOPHEN 5-325 MG PO TABS: 1.0000 | ORAL_TABLET | ORAL | Status: DC | PRN

## 2018-11-12 MED ORDER — DIPHENHYDRAMINE HCL 50 MG/ML IJ SOLN: 12.5000 mg | Freq: Four times a day (QID) | INTRAMUSCULAR | Status: DC | PRN

## 2018-11-12 MED ORDER — SIMETHICONE 80 MG PO CHEW
40.0000 mg | CHEWABLE_TABLET | Freq: Four times a day (QID) | ORAL | Status: DC | PRN
  Filled 2018-11-12: qty 1

## 2018-11-12 MED ORDER — HYDROCODONE-ACETAMINOPHEN 5-325 MG PO TABS
1.0000 | ORAL_TABLET | ORAL | Status: DC | PRN
  Administered 2018-11-12 – 2018-11-13 (×4): 1 via ORAL
  Filled 2018-11-12 (×4): qty 1

## 2018-11-12 MED ORDER — MIDAZOLAM HCL 2 MG/2ML IJ SOLN
INTRAMUSCULAR | Status: AC
  Filled 2018-11-12: qty 2

## 2018-11-12 MED ORDER — DIPHENHYDRAMINE HCL 12.5 MG/5ML PO ELIX: 12.5000 mg | ORAL_SOLUTION | Freq: Four times a day (QID) | ORAL | Status: DC | PRN

## 2018-11-12 MED ORDER — MORPHINE SULFATE (PF) 2 MG/ML IV SOLN: 1.0000 mg | INTRAVENOUS | Status: DC | PRN

## 2018-11-12 MED ORDER — SODIUM CHLORIDE 0.9 % IV SOLN
2.0000 g | INTRAVENOUS | Status: DC
  Administered 2018-11-12 – 2018-11-13 (×2): 2 g via INTRAVENOUS
  Filled 2018-11-12: qty 2
  Filled 2018-11-12: qty 20

## 2018-11-12 MED ORDER — MORPHINE SULFATE (PF) 2 MG/ML IV SOLN
2.0000 mg | INTRAVENOUS | Status: DC | PRN
  Administered 2018-11-12: 2 mg via INTRAVENOUS
  Filled 2018-11-12: qty 1

## 2018-11-12 MED ORDER — ACETAMINOPHEN 10 MG/ML IV SOLN
INTRAVENOUS | Status: DC | PRN
  Administered 2018-11-12: 1000 mg via INTRAVENOUS

## 2018-11-12 MED ORDER — FENTANYL CITRATE (PF) 100 MCG/2ML IJ SOLN
INTRAMUSCULAR | Status: DC | PRN
  Administered 2018-11-12: 100 ug via INTRAVENOUS
  Administered 2018-11-12 (×2): 50 ug via INTRAVENOUS

## 2018-11-12 MED ORDER — ROCURONIUM BROMIDE 10 MG/ML (PF) SYRINGE
PREFILLED_SYRINGE | INTRAVENOUS | Status: DC | PRN
  Administered 2018-11-12: 50 mg via INTRAVENOUS
  Administered 2018-11-12: 10 mg via INTRAVENOUS

## 2018-11-12 MED ORDER — LIDOCAINE 2% (20 MG/ML) 5 ML SYRINGE
INTRAMUSCULAR | Status: DC | PRN
  Administered 2018-11-12: 100 mg via INTRAVENOUS

## 2018-11-12 MED ORDER — BUPIVACAINE HCL (PF) 0.25 % IJ SOLN
INTRAMUSCULAR | Status: AC
  Filled 2018-11-12: qty 30

## 2018-11-12 MED ORDER — HYDROMORPHONE HCL 1 MG/ML IJ SOLN
INTRAMUSCULAR | Status: AC
  Filled 2018-11-12: qty 1

## 2018-11-12 MED ORDER — ONDANSETRON HCL 4 MG/2ML IJ SOLN
INTRAMUSCULAR | Status: DC | PRN
  Administered 2018-11-12: 4 mg via INTRAVENOUS

## 2018-11-12 MED ORDER — MIDAZOLAM HCL 5 MG/5ML IJ SOLN
INTRAMUSCULAR | Status: DC | PRN
  Administered 2018-11-12: 2 mg via INTRAVENOUS

## 2018-11-12 MED ORDER — ENOXAPARIN SODIUM 40 MG/0.4ML ~~LOC~~ SOLN
40.0000 mg | Freq: Every day | SUBCUTANEOUS | Status: DC
  Administered 2018-11-13: 40 mg via SUBCUTANEOUS
  Filled 2018-11-12: qty 0.4

## 2018-11-12 MED ORDER — 0.9 % SODIUM CHLORIDE (POUR BTL) OPTIME
TOPICAL | Status: DC | PRN
  Administered 2018-11-12: 1000 mL

## 2018-11-12 MED ORDER — POTASSIUM CHLORIDE IN NACL 20-0.45 MEQ/L-% IV SOLN
INTRAVENOUS | Status: DC
  Administered 2018-11-12: 03:00:00 via INTRAVENOUS
  Filled 2018-11-12 (×3): qty 1000

## 2018-11-12 MED ORDER — DOCUSATE SODIUM 100 MG PO CAPS
100.0000 mg | ORAL_CAPSULE | Freq: Two times a day (BID) | ORAL | Status: DC
  Administered 2018-11-12 – 2018-11-13 (×2): 100 mg via ORAL
  Filled 2018-11-12 (×2): qty 1

## 2018-11-12 MED ORDER — LACTATED RINGERS IV SOLN
INTRAVENOUS | Status: DC
  Administered 2018-11-12 (×2): via INTRAVENOUS

## 2018-11-12 MED ORDER — PROMETHAZINE HCL 25 MG/ML IJ SOLN: 6.2500 mg | INTRAMUSCULAR | Status: DC | PRN

## 2018-11-12 MED ORDER — OXYCODONE HCL 5 MG/5ML PO SOLN: 5.0000 mg | Freq: Once | ORAL | Status: DC | PRN

## 2018-11-12 MED ORDER — KETOROLAC TROMETHAMINE 30 MG/ML IJ SOLN
INTRAMUSCULAR | Status: AC
  Filled 2018-11-12: qty 1

## 2018-11-12 MED ORDER — SODIUM CHLORIDE (PF) 0.9 % IJ SOLN
INTRAMUSCULAR | Status: AC
  Filled 2018-11-12: qty 10

## 2018-11-12 MED ORDER — ACETAMINOPHEN 500 MG PO TABS
1000.0000 mg | ORAL_TABLET | Freq: Four times a day (QID) | ORAL | Status: DC
  Administered 2018-11-12: 1000 mg via ORAL
  Filled 2018-11-12: qty 2

## 2018-11-12 MED ORDER — ONDANSETRON 4 MG PO TBDP: 4.0000 mg | ORAL_TABLET | Freq: Four times a day (QID) | ORAL | Status: DC | PRN

## 2018-11-12 MED ORDER — ACETAMINOPHEN 10 MG/ML IV SOLN
INTRAVENOUS | Status: AC
  Filled 2018-11-12: qty 100

## 2018-11-12 MED ORDER — MEPERIDINE HCL 50 MG/ML IJ SOLN: 6.2500 mg | INTRAMUSCULAR | Status: DC | PRN

## 2018-11-12 MED ORDER — KETOROLAC TROMETHAMINE 30 MG/ML IJ SOLN
30.0000 mg | Freq: Once | INTRAMUSCULAR | Status: AC | PRN
  Administered 2018-11-12: 30 mg via INTRAVENOUS

## 2018-11-12 MED ORDER — SUGAMMADEX SODIUM 200 MG/2ML IV SOLN
INTRAVENOUS | Status: DC | PRN
  Administered 2018-11-12: 150 mg via INTRAVENOUS

## 2018-11-12 MED ORDER — ONDANSETRON HCL 4 MG/2ML IJ SOLN
4.0000 mg | Freq: Four times a day (QID) | INTRAMUSCULAR | Status: DC | PRN
  Administered 2018-11-12 (×2): 4 mg via INTRAVENOUS
  Filled 2018-11-12 (×2): qty 2

## 2018-11-12 MED ORDER — ENOXAPARIN SODIUM 40 MG/0.4ML ~~LOC~~ SOLN
40.0000 mg | Freq: Every day | SUBCUTANEOUS | Status: DC
  Filled 2018-11-12: qty 0.4

## 2018-11-12 SURGICAL SUPPLY — 37 items
ADH SKN CLS APL DERMABOND .7 (GAUZE/BANDAGES/DRESSINGS) ×1
APL PRP STRL LF DISP 70% ISPRP (MISCELLANEOUS) ×1
APPLIER CLIP 5 13 M/L LIGAMAX5 (MISCELLANEOUS) ×2
APR CLP MED LRG 5 ANG JAW (MISCELLANEOUS) ×1
BAG SPEC RTRVL LRG 6X4 10 (ENDOMECHANICALS) ×1
BIOPATCH RED 1 DISK 7.0 (GAUZE/BANDAGES/DRESSINGS) ×1 IMPLANT
CABLE HIGH FREQUENCY MONO STRZ (ELECTRODE) ×2 IMPLANT
CATH REDDICK CHOLANGI 4FR 50CM (CATHETERS) ×2 IMPLANT
CHLORAPREP W/TINT 26 (MISCELLANEOUS) ×2 IMPLANT
CLIP APPLIE 5 13 M/L LIGAMAX5 (MISCELLANEOUS) ×1 IMPLANT
COVER MAYO STAND STRL (DRAPES) ×2 IMPLANT
COVER WAND RF STERILE (DRAPES) ×1 IMPLANT
DECANTER SPIKE VIAL GLASS SM (MISCELLANEOUS) ×2 IMPLANT
DERMABOND ADVANCED (GAUZE/BANDAGES/DRESSINGS) ×1
DERMABOND ADVANCED .7 DNX12 (GAUZE/BANDAGES/DRESSINGS) ×1 IMPLANT
DRAPE C-ARM 42X120 X-RAY (DRAPES) ×2 IMPLANT
DRSG TEGADERM 4X4.75 (GAUZE/BANDAGES/DRESSINGS) ×1 IMPLANT
ELECT REM PT RETURN 15FT ADLT (MISCELLANEOUS) ×2 IMPLANT
EVACUATOR SILICONE 100CC (DRAIN) ×1 IMPLANT
GLOVE BIO SURGEON STRL SZ7.5 (GLOVE) ×2 IMPLANT
GOWN STRL REUS W/TWL XL LVL3 (GOWN DISPOSABLE) ×6 IMPLANT
HEMOSTAT SURGICEL 4X8 (HEMOSTASIS) ×1 IMPLANT
IV CATH 14GX2 1/4 (CATHETERS) ×2 IMPLANT
KIT BASIN OR (CUSTOM PROCEDURE TRAY) ×2 IMPLANT
KIT TURNOVER KIT A (KITS) IMPLANT
POUCH SPECIMEN RETRIEVAL 10MM (ENDOMECHANICALS) ×2 IMPLANT
SCISSORS LAP 5X35 DISP (ENDOMECHANICALS) ×2 IMPLANT
SET IRRIG TUBING LAPAROSCOPIC (IRRIGATION / IRRIGATOR) ×2 IMPLANT
SET TUBE SMOKE EVAC HIGH FLOW (TUBING) ×2 IMPLANT
SLEEVE XCEL OPT CAN 5 100 (ENDOMECHANICALS) ×4 IMPLANT
SUT ETHILON 2 0 PS N (SUTURE) ×1 IMPLANT
SUT MNCRL AB 4-0 PS2 18 (SUTURE) ×2 IMPLANT
TOWEL OR 17X26 10 PK STRL BLUE (TOWEL DISPOSABLE) ×2 IMPLANT
TOWEL OR NON WOVEN STRL DISP B (DISPOSABLE) ×2 IMPLANT
TRAY LAPAROSCOPIC (CUSTOM PROCEDURE TRAY) ×2 IMPLANT
TROCAR BLADELESS OPT 5 100 (ENDOMECHANICALS) ×2 IMPLANT
TROCAR XCEL BLUNT TIP 100MML (ENDOMECHANICALS) ×2 IMPLANT

## 2018-11-12 NOTE — Anesthesia Procedure Notes (Signed)
Procedure Name: Intubation Date/Time: 11/12/2018 1:57 PM Performed by: Sharlette Dense, CRNA Patient Re-evaluated:Patient Re-evaluated prior to induction Oxygen Delivery Method: Circle system utilized Preoxygenation: Pre-oxygenation with 100% oxygen Induction Type: IV induction Ventilation: Mask ventilation without difficulty and Oral airway inserted - appropriate to patient size Laryngoscope Size: Sabra Heck and 2 Grade View: Grade I Tube type: Oral Tube size: 7.0 mm Number of attempts: 2 Airway Equipment and Method: Stylet Placement Confirmation: ETT inserted through vocal cords under direct vision,  positive ETCO2 and breath sounds checked- equal and bilateral Secured at: 21 cm Tube secured with: Tape Dental Injury: Teeth and Oropharynx as per pre-operative assessment

## 2018-11-12 NOTE — Anesthesia Postprocedure Evaluation (Signed)
Anesthesia Post Note  Patient: Miranda Valentine [facility]  Procedure(s) Performed: LAPAROSCOPIC CHOLECYSTECTOMY WITH INTRAOPERATIVE CHOLANGIOGRAM (N/A Abdomen)     Patient location during evaluation: PACU Anesthesia Type: General Level of consciousness: awake and alert Pain management: pain level controlled Vital Signs Assessment: post-procedure vital signs reviewed and stable Respiratory status: spontaneous breathing, nonlabored ventilation and respiratory function stable Cardiovascular status: blood pressure returned to baseline and stable Postop Assessment: no apparent nausea or vomiting Anesthetic complications: no    Last Vitals:  Vitals:   11/12/18 1600 11/12/18 1615  BP: 108/61 100/69  Pulse: 95 93  Resp: 10 10  Temp:  37 C  SpO2: 93% 95%    Last Pain:  Vitals:   11/12/18 1615  TempSrc:   PainSc: Asleep                 Lynda Rainwater

## 2018-11-12 NOTE — Progress Notes (Signed)
   Subjective/Chief Complaint: Feels better   Objective: Vital signs in last 24 hours: Temp:  [98.3 F (36.8 C)] 98.3 F (36.8 C) (10/15 1247) Pulse Rate:  [90-104] 101 (10/16 0800) Resp:  [16-23] 18 (10/16 0800) BP: (109-128)/(49-93) 118/58 (10/16 0800) SpO2:  [89 %-100 %] 97 % (10/16 0800) Weight:  [69.4 kg] 69.4 kg (10/15 1259)    Intake/Output from previous day: 10/15 0701 - 10/16 0700 In: 150 [IV Piggyback:150] Out: -  Intake/Output this shift: No intake/output data recorded.  General appearance: alert and cooperative Resp: clear to auscultation bilaterally Cardio: regular rate and rhythm GI: soft, mild tenderness  Lab Results:  Recent Labs    11/11/18 1311 11/12/18 0543  WBC 14.8* 12.2*  HGB 12.9 11.7*  HCT 39.8 35.7*  PLT 279 255   BMET Recent Labs    11/11/18 1311 11/12/18 0543  NA 135 134*  K 3.8 4.4  CL 101 101  CO2 23 20*  GLUCOSE 115* 109*  BUN 14 15  CREATININE 0.80 0.76  CALCIUM 8.8* 8.4*   PT/INR No results for input(s): LABPROT, INR in the last 72 hours. ABG No results for input(s): PHART, HCO3 in the last 72 hours.  Invalid input(s): PCO2, PO2  Studies/Results: US Abdomen Limited Ruq  Result Date: 11/11/2018 CLINICAL DATA:  Right upper quadrant pain EXAM: ULTRASOUND ABDOMEN LIMITED RIGHT UPPER QUADRANT COMPARISON:  None. FINDINGS: Gallbladder: Within the gallbladder, there are multiple echogenic foci which move and shadow consistent with cholelithiasis. Largest gallstone measures 1.3 cm in length. Several gallstones are apparent in the neck of the gallbladder. The gallbladder wall is thickened with mild pericholecystic fluid and edema. The patient is tender over the gallbladder. Patient is tender over the gallbladder. Common bile duct: Diameter: 4 mm. There is questionable slight intrahepatic biliary duct dilatation. No common bile duct dilatation evident. Liver: No focal lesion identified. Liver echogenicity is overall increased.  Portal vein is patent on color Doppler imaging with normal direction of blood flow towards the liver. Other: There is trace ascites. IMPRESSION: 1. Findings indicative of acute cholecystitis. Specifically, there are gallstones with gallbladder wall thickening and edema. There is slight pericholecystic fluid. Patient is focally tender over the gallbladder. 2. Common bile duct appears normal. Equivocal intrahepatic biliary duct dilatation. 3. Increased liver echogenicity, a finding indicative of hepatic steatosis. No focal liver lesions are demonstrable. 4.  Trace ascites. These results will be called to the ordering clinician or representative by the Radiologist Assistant, and communication documented in the PACS or zVision Dashboard. Electronically Signed   By: Lowella Grip III M.D.   On: 11/11/2018 10:20    Anti-infectives: Anti-infectives (From admission, onward)   Start     Dose/Rate Route Frequency Ordered Stop   11/12/18 0215  cefTRIAXone (ROCEPHIN) 2 g in sodium chloride 0.9 % 100 mL IVPB     2 g 200 mL/hr over 30 Minutes Intravenous Every 24 hours 11/12/18 0215     11/11/18 1845  piperacillin-tazobactam (ZOSYN) IVPB 3.375 g     3.375 g 100 mL/hr over 30 Minutes Intravenous  Once 11/11/18 1834 11/11/18 2110      Assessment/Plan: s/p Procedure(s): LAPAROSCOPIC CHOLECYSTECTOMY WITH INTRAOPERATIVE CHOLANGIOGRAM (N/A) Cholelithiasis with cholecystitis  Plan for surgery today. Risks and benefits of the surgery as well as some of the technical aspects including the risk of cbd injury discussed with patient and she understands and wishes to proceed  LOS: 0 days    Autumn Messing III 11/12/2018

## 2018-11-12 NOTE — H&P (View-Only) (Signed)
   Subjective/Chief Complaint: Feels better   Objective: Vital signs in last 24 hours: Temp:  [98.3 F (36.8 C)] 98.3 F (36.8 C) (10/15 1247) Pulse Rate:  [90-104] 101 (10/16 0800) Resp:  [16-23] 18 (10/16 0800) BP: (109-128)/(49-93) 118/58 (10/16 0800) SpO2:  [89 %-100 %] 97 % (10/16 0800) Weight:  [69.4 kg] 69.4 kg (10/15 1259)    Intake/Output from previous day: 10/15 0701 - 10/16 0700 In: 150 [IV Piggyback:150] Out: -  Intake/Output this shift: No intake/output data recorded.  General appearance: alert and cooperative Resp: clear to auscultation bilaterally Cardio: regular rate and rhythm GI: soft, mild tenderness  Lab Results:  Recent Labs    11/11/18 1311 11/12/18 0543  WBC 14.8* 12.2*  HGB 12.9 11.7*  HCT 39.8 35.7*  PLT 279 255   BMET Recent Labs    11/11/18 1311 11/12/18 0543  NA 135 134*  K 3.8 4.4  CL 101 101  CO2 23 20*  GLUCOSE 115* 109*  BUN 14 15  CREATININE 0.80 0.76  CALCIUM 8.8* 8.4*   PT/INR No results for input(s): LABPROT, INR in the last 72 hours. ABG No results for input(s): PHART, HCO3 in the last 72 hours.  Invalid input(s): PCO2, PO2  Studies/Results: Us Abdomen Limited Ruq  Result Date: 11/11/2018 CLINICAL DATA:  Right upper quadrant pain EXAM: ULTRASOUND ABDOMEN LIMITED RIGHT UPPER QUADRANT COMPARISON:  None. FINDINGS: Gallbladder: Within the gallbladder, there are multiple echogenic foci which move and shadow consistent with cholelithiasis. Largest gallstone measures 1.3 cm in length. Several gallstones are apparent in the neck of the gallbladder. The gallbladder wall is thickened with mild pericholecystic fluid and edema. The patient is tender over the gallbladder. Patient is tender over the gallbladder. Common bile duct: Diameter: 4 mm. There is questionable slight intrahepatic biliary duct dilatation. No common bile duct dilatation evident. Liver: No focal lesion identified. Liver echogenicity is overall increased.  Portal vein is patent on color Doppler imaging with normal direction of blood flow towards the liver. Other: There is trace ascites. IMPRESSION: 1. Findings indicative of acute cholecystitis. Specifically, there are gallstones with gallbladder wall thickening and edema. There is slight pericholecystic fluid. Patient is focally tender over the gallbladder. 2. Common bile duct appears normal. Equivocal intrahepatic biliary duct dilatation. 3. Increased liver echogenicity, a finding indicative of hepatic steatosis. No focal liver lesions are demonstrable. 4.  Trace ascites. These results will be called to the ordering clinician or representative by the Radiologist Assistant, and communication documented in the PACS or zVision Dashboard. Electronically Signed   By: William  Woodruff III M.D.   On: 11/11/2018 10:20    Anti-infectives: Anti-infectives (From admission, onward)   Start     Dose/Rate Route Frequency Ordered Stop   11/12/18 0215  cefTRIAXone (ROCEPHIN) 2 g in sodium chloride 0.9 % 100 mL IVPB     2 g 200 mL/hr over 30 Minutes Intravenous Every 24 hours 11/12/18 0215     11/11/18 1845  piperacillin-tazobactam (ZOSYN) IVPB 3.375 g     3.375 g 100 mL/hr over 30 Minutes Intravenous  Once 11/11/18 1834 11/11/18 2110      Assessment/Plan: s/p Procedure(s): LAPAROSCOPIC CHOLECYSTECTOMY WITH INTRAOPERATIVE CHOLANGIOGRAM (N/A) Cholelithiasis with cholecystitis  Plan for surgery today. Risks and benefits of the surgery as well as some of the technical aspects including the risk of cbd injury discussed with patient and she understands and wishes to proceed  LOS: 0 days    Riot Barrick Toth III 11/12/2018 

## 2018-11-12 NOTE — Transfer of Care (Signed)
Immediate Anesthesia Transfer of Care Note  Patient: Keylee Shrestha [facility]  Procedure(s) Performed: LAPAROSCOPIC CHOLECYSTECTOMY WITH INTRAOPERATIVE CHOLANGIOGRAM (N/A Abdomen)  Patient Location: PACU  Anesthesia Type:General  Level of Consciousness: awake, alert  and oriented  Airway & Oxygen Therapy: Patient Spontanous Breathing and Patient connected to face mask oxygen  Post-op Assessment: Report given to RN and Post -op Vital signs reviewed and stable  Post vital signs: Reviewed and stable  Last Vitals:  Vitals Value Taken Time  BP 122/63 11/12/18 1522  Temp    Pulse 98 11/12/18 1523  Resp 17 11/12/18 1523  SpO2 100 % 11/12/18 1523  Vitals shown include unvalidated device data.  Last Pain:  Vitals:   11/12/18 1245  TempSrc: Oral  PainSc:       Patients Stated Pain Goal: 3 (48/88/91 6945)  Complications: No apparent anesthesia complications

## 2018-11-12 NOTE — Anesthesia Preprocedure Evaluation (Signed)
Anesthesia Evaluation  Patient identified by MRN, date of birth, ID band Patient awake    Reviewed: Allergy & Precautions, NPO status , Patient's Chart, lab work & pertinent test results  Airway Mallampati: II  TM Distance: >3 FB Neck ROM: Full    Dental no notable dental hx.    Pulmonary asthma ,    Pulmonary exam normal breath sounds clear to auscultation       Cardiovascular negative cardio ROS Normal cardiovascular exam Rhythm:Regular Rate:Normal     Neuro/Psych  Headaches, Depression negative psych ROS   GI/Hepatic negative GI ROS, Neg liver ROS,   Endo/Other  negative endocrine ROS  Renal/GU negative Renal ROS  negative genitourinary   Musculoskeletal negative musculoskeletal ROS (+)   Abdominal   Peds negative pediatric ROS (+)  Hematology negative hematology ROS (+)   Anesthesia Other Findings   Reproductive/Obstetrics negative OB ROS                             Anesthesia Physical Anesthesia Plan  ASA: II  Anesthesia Plan: General   Post-op Pain Management:    Induction: Intravenous  PONV Risk Score and Plan: 3 and Ondansetron, Dexamethasone, Midazolam and Treatment may vary due to age or medical condition  Airway Management Planned: Oral ETT  Additional Equipment:   Intra-op Plan:   Post-operative Plan: Extubation in OR  Informed Consent: I have reviewed the patients History and Physical, chart, labs and discussed the procedure including the risks, benefits and alternatives for the proposed anesthesia with the patient or authorized representative who has indicated his/her understanding and acceptance.     Dental advisory given  Plan Discussed with: CRNA  Anesthesia Plan Comments:         Anesthesia Quick Evaluation

## 2018-11-12 NOTE — Interval H&P Note (Signed)
History and Physical Interval Note:  11/12/2018 1:28 PM  Miranda Valentine  has presented today for surgery, with the diagnosis of cholelithiasis.  The various methods of treatment have been discussed with the patient and family. After consideration of risks, benefits and other options for treatment, the patient has consented to  Procedure(s): LAPAROSCOPIC CHOLECYSTECTOMY WITH INTRAOPERATIVE CHOLANGIOGRAM (N/A) as a surgical intervention.  The patient's history has been reviewed, patient examined, no change in status, stable for surgery.  I have reviewed the patient's chart and labs.  Questions were answered to the patient's satisfaction.     Autumn Messing III

## 2018-11-12 NOTE — ED Notes (Signed)
ED TO INPATIENT HANDOFF REPORT  ED Nurse Name and Phone #:   Mordechai Matuszak RN   S Name/Age/Gender Miranda Valentine 63 y.o. female Room/Bed: WA13/WA13  Code Status   Code Status: Full Code  Home/SNF/Other Home Patient oriented to: self, place, time and situation Is this baseline? Yes   Triage Complete: Triage complete  Chief Complaint pain under ribs  Triage Note Patient reports that she has been having pain under her right rib area x 2 days. Patient had an [place] today which she stated showed cholecystitis and gallstones. Patient states she was told to come to the ED.   Allergies No Known Allergies  Level of Care/Admitting Diagnosis ED Disposition    ED Disposition Condition Comment   Admit  Hospital Area: [place] [[id number]]  Level of Care: Med-Surg [16]  Covid Evaluation: Asymptomatic Screening Protocol (No Symptoms)  Diagnosis: Acute cholecystitis [575.0.ICD-9-CM]  Admitting Physician: CCS, [place]  Attending Physician: CCS, MD [[id number]  PT Class (Do Not Modify): Observation [104]  PT Acc Code (Do Not Modify): Observation [10022]       B Medical/Surgery History Past Medical History:  Diagnosis Date  . Anginal pain ([place]) 11/07/2011  . Asthma   . Cholecystitis   . Depression   . Migraines   . Osteopenia   . Ovarian cyst    benign  . 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 ABDOMINAL HYSTERECTOMY  1982   TAH/RSO  . LSO  1981  . TONSILLECTOMY AND ADENOIDECTOMY  1997     A IV Location/Drains/Wounds Patient Lines/Drains/Airways Status   Active Line/Drains/Airways    Name:   Placement date:   Placement time:   Site:   Days:   Peripheral IV 11/11/18 Left;Upper Arm   11/11/18    2035    Arm   1          Intake/Output Last 24 hours  Intake/Output Summary (Last 24 hours) at 11/12/2018 9163 Last data filed at 11/12/2018 8466 Gross per 24 hour  Intake 150 ml  Output -  Net 150 ml     Labs/Imaging Results for orders placed or performed during the hospital encounter of 11/11/18 (from the past 48 hour(s))  Lipase, blood     Status: None   Collection Time: 11/11/18  1:11 PM  Result Value Ref Range   Lipase 21 11 - 51 U/L    Comment: Performed at [facility], [place] [address], [place], [place] [address]  Comprehensive metabolic panel     Status: Abnormal   Collection Time: 11/11/18  1:11 PM  Result Value Ref Range   Sodium 135 135 - 145 mmol/L   Potassium 3.8 3.5 - 5.1 mmol/L   Chloride 101 98 - 111 mmol/L   CO2 23 22 - 32 mmol/L   Glucose, Bld 115 (H) 70 - 99 mg/dL   BUN 14 8 - 23 mg/dL   Creatinine, Ser 0.80 0.44 - 1.00 mg/dL   Calcium 8.8 (L) 8.9 - 10.3 mg/dL   Total Protein 7.5 6.5 - 8.1 g/dL   Albumin 4.0 3.5 - 5.0 g/dL   AST 67 (H) 15 - 41 U/L   ALT 70 (H) 0 - 44 U/L   Alkaline Phosphatase 109 38 - 126 U/L   Total Bilirubin 1.1 0.3 - 1.2 mg/dL   GFR calc non Af Amer >60 >60 mL/min   GFR calc Af Amer >60 >60  mL/min   Anion gap 11 5 - 15    Comment: Performed at [facility], [address] [place], [place] [address]  CBC     Status: Abnormal   Collection Time: 11/11/18  1:11 PM  Result Value Ref Range   WBC 14.8 (H) 4.0 - 10.5 K/uL   RBC 4.29 3.87 - 5.11 MIL/uL   Hemoglobin 12.9 12.0 - 15.0 g/dL   HCT 60.439.8 54.036.0 - 98.146.0 %   MCV 92.8 80.0 - 100.0 fL   MCH 30.1 26.0 - 34.0 pg   MCHC 32.4 30.0 - 36.0 g/dL   RDW 19.113.0 47.811.5 - 29.515.5 %   Platelets 279 150 - 400 K/uL   nRBC 0.0 0.0 - 0.2 %    Comment: Performed at [facility], [address] [place], [place] [address]  Urinalysis, Routine w reflex microscopic     Status: Abnormal   Collection Time: 11/11/18  5:55 PM  Result Value Ref Range   Color, Urine AMBER (A) YELLOW    Comment: BIOCHEMICALS MAY BE AFFECTED BY COLOR   APPearance HAZY (A) CLEAR   Specific Gravity, Urine 1.023 1.005 - 1.030   pH 5.0 5.0 - 8.0   Glucose, UA NEGATIVE NEGATIVE  mg/dL   Hgb urine dipstick SMALL (A) NEGATIVE   Bilirubin Urine NEGATIVE NEGATIVE   Ketones, ur 20 (A) NEGATIVE mg/dL   Protein, ur 30 (A) NEGATIVE mg/dL   Nitrite NEGATIVE NEGATIVE   Leukocytes,Ua NEGATIVE NEGATIVE   RBC / HPF 0-5 0 - 5 RBC/hpf   Bacteria, UA RARE (A) NONE SEEN   Squamous Epithelial / LPF 6-10 0 - 5   Mucus PRESENT    Hyaline Casts, UA PRESENT    Non Squamous Epithelial 0-5 (A) NONE SEEN    Comment: Performed at [facility], [address] [place], [place] [address]  SARS Coronavirus 2 by RT PCR (hospital order, performed in [facility] hospital lab) Nasopharyngeal Nasopharyngeal Swab     Status: None   Collection Time: 11/11/18  6:28 PM   Specimen: Nasopharyngeal Swab  Result Value Ref Range   SARS Coronavirus 2 NEGATIVE NEGATIVE    Comment: (NOTE) If result is NEGATIVE SARS-CoV-2 target nucleic acids are NOT DETECTED. The SARS-CoV-2 RNA is generally detectable in upper and lower  respiratory specimens during the acute phase of infection. The lowest  concentration of SARS-CoV-2 viral copies this assay can detect is 250  copies / mL. A negative result does not preclude SARS-CoV-2 infection  and should not be used as the sole basis for treatment or other  patient management decisions.  A negative result may occur with  improper specimen collection / handling, submission of specimen other  than nasopharyngeal swab, presence of viral mutation(s) within the  areas targeted by this assay, and inadequate number of viral copies  (<250 copies / mL). A negative result must be combined with clinical  observations, patient history, and epidemiological information. If result is POSITIVE SARS-CoV-2 target nucleic acids are DETECTED. The SARS-CoV-2 RNA is generally detectable in upper and lower  respiratory specimens dur ing the acute phase of infection.  Positive  results are indicative of active infection with SARS-CoV-2.  Clinical  correlation with  patient history and other diagnostic information is  necessary to determine patient infection status.  Positive results do  not rule out bacterial infection or co-infection with other viruses. If result is PRESUMPTIVE POSTIVE SARS-CoV-2 nucleic acids MAY BE PRESENT.   A  presumptive positive result was obtained on the submitted specimen  and confirmed on repeat testing.  While 2019 novel coronavirus  (SARS-CoV-2) nucleic acids may be present in the submitted sample  additional confirmatory testing may be necessary for epidemiological  and / or clinical management purposes  to differentiate between  SARS-CoV-2 and other Sarbecovirus currently known to infect humans.  If clinically indicated additional testing with an alternate test  methodology [phone]) is advised. The SARS-CoV-2 RNA is generally  detectable in upper and lower respiratory sp ecimens during the acute  phase of infection. The expected result is Negative. Fact Sheet for Patients:  BoilerBrush.com.cy Fact Sheet for Healthcare Providers: https://pope.com/ This test is not yet approved or cleared by the [place] FDA and has been authorized for detection and/or diagnosis of SARS-CoV-2 by FDA under an Emergency Use Authorization (EUA).  This EUA will remain in effect (meaning this test can be used) for the duration of the COVID-19 declaration under Section 564(b)(1) of the Act, 21 U.S.C. section 360bbb-3(b)(1), unless the authorization is terminated or revoked sooner. Performed at [facility], [address], [place], [place] [address]   Comprehensive metabolic panel     Status: Abnormal   Collection Time: 11/12/18  5:43 AM  Result Value Ref Range   Sodium 134 (L) 135 - 145 mmol/L   Potassium 4.4 3.5 - 5.1 mmol/L   Chloride 101 98 - 111 mmol/L   CO2 20 (L) 22 - 32 mmol/L   Glucose, Bld 109 (H) 70 - 99 mg/dL   BUN 15 8 - 23 mg/dL   Creatinine, Ser  8.41 0.44 - 1.00 mg/dL   Calcium 8.4 (L) 8.9 - 10.3 mg/dL   Total Protein 6.9 6.5 - 8.1 g/dL   Albumin 3.2 (L) 3.5 - 5.0 g/dL   AST 65 (H) 15 - 41 U/L   ALT 60 (H) 0 - 44 U/L   Alkaline Phosphatase 144 (H) 38 - 126 U/L   Total Bilirubin 1.0 0.3 - 1.2 mg/dL   GFR calc non Af Amer >60 >60 mL/min   GFR calc Af Amer >60 >60 mL/min   Anion gap 13 5 - 15    Comment: Performed at [facility], [address] [place], [place] [address]  CBC     Status: Abnormal   Collection Time: 11/12/18  5:43 AM  Result Value Ref Range   WBC 12.2 (H) 4.0 - 10.5 K/uL   RBC 3.84 (L) 3.87 - 5.11 MIL/uL   Hemoglobin 11.7 (L) 12.0 - 15.0 g/dL   HCT 10.2 (L) 72.5 - 36.6 %   MCV 93.0 80.0 - 100.0 fL   MCH 30.5 26.0 - 34.0 pg   MCHC 32.8 30.0 - 36.0 g/dL   RDW 44.0 34.7 - 42.5 %   Platelets 255 150 - 400 K/uL   nRBC 0.0 0.0 - 0.2 %    Comment: Performed at [facility], [address], [place], [place] [address]   US Abdomen Limited Ruq  Result Date: 11/11/2018 CLINICAL DATA:  Right upper quadrant pain EXAM: ULTRASOUND ABDOMEN LIMITED RIGHT UPPER QUADRANT COMPARISON:  None. FINDINGS: Gallbladder: Within the gallbladder, there are multiple echogenic foci which move and shadow consistent with cholelithiasis. Largest gallstone measures 1.3 cm in length. Several gallstones are apparent in the neck of the gallbladder. The gallbladder wall is thickened with mild pericholecystic fluid and edema. The patient is tender over the gallbladder. Patient is tender over the gallbladder. Common bile duct:  Diameter: 4 mm. There is questionable slight intrahepatic biliary duct dilatation. No common bile duct dilatation evident. Liver: No focal lesion identified. Liver echogenicity is overall increased. Portal vein is patent on color Doppler imaging with normal direction of blood flow towards the liver. Other: There is trace ascites. IMPRESSION: 1. Findings indicative of acute cholecystitis.  Specifically, there are gallstones with gallbladder wall thickening and edema. There is slight pericholecystic fluid. Patient is focally tender over the gallbladder. 2. Common bile duct appears normal. Equivocal intrahepatic biliary duct dilatation. 3. Increased liver echogenicity, a finding indicative of hepatic steatosis. No focal liver lesions are demonstrable. 4.  Trace ascites. These results will be called to the ordering clinician or representative by the Radiologist Assistant, and communication documented in the PACS or zVision Dashboard. Electronically Signed   By: Bretta Bang III M.D.   On: 11/11/2018 10:20    Pending Labs Unresulted Labs (From admission, onward)    Start     Ordered   11/12/18 0615  HIV Antibody (routine testing w rflx)  Once,   R     11/12/18 0615   11/12/18 0615  HIV4GL Save Tube  Once,   R     11/12/18 0615   11/11/18 1740  Urine culture  ONCE - STAT,   STAT     11/11/18 1739          Vitals/Pain Today's Vitals   11/12/18 0600 11/12/18 0611 11/12/18 0630 11/12/18 0700  BP: 109/60  115/60 121/67  Pulse: 99  100 100  Resp: 20  18 16   Temp:      TempSrc:      SpO2: 93%  91% 96%  Weight:      Height:      PainSc:  3       Isolation Precautions No active isolations  Medications Medications  enoxaparin (LOVENOX) injection 40 mg (has no administration in time range)  0.45 % NaCl with KCl 20 mEq / L infusion ( Intravenous New Bag/Given 11/12/18 0306)  cefTRIAXone (ROCEPHIN) 2 g in sodium chloride 0.9 % 100 mL IVPB (0 g Intravenous Stopped 11/12/18 0337)  acetaminophen (TYLENOL) tablet 1,000 mg (1,000 mg Oral Not Given 11/12/18 0618)  HYDROcodone-acetaminophen (NORCO/VICODIN) 5-325 MG per tablet 1-2 tablet (has no administration in time range)  morphine 2 MG/ML injection 2 mg (2 mg Intravenous Given 11/12/18 0258)  diphenhydrAMINE (BENADRYL) 12.5 MG/5ML elixir 12.5 mg (has no administration in time range)    Or  diphenhydrAMINE (BENADRYL)  injection 12.5 mg (has no administration in time range)  docusate sodium (COLACE) capsule 100 mg (100 mg Oral Not Given 11/12/18 0249)  ondansetron (ZOFRAN-ODT) disintegrating tablet 4 mg ( Oral See Alternative 11/12/18 0258)    Or  ondansetron (ZOFRAN) injection 4 mg (4 mg Intravenous Given 11/12/18 0258)  simethicone (MYLICON) chewable tablet 40 mg (has no administration in time range)  sodium chloride flush (NS) 0.9 % injection 3 mL (3 mLs Intravenous Given 11/11/18 2047)  piperacillin-tazobactam (ZOSYN) IVPB 3.375 g (0 g Intravenous Stopped 11/11/18 2110)  HYDROmorphone (DILAUDID) injection 1 mg (1 mg Intravenous Given 11/11/18 2035)  ondansetron (ZOFRAN) injection 4 mg (4 mg Intravenous Given 11/11/18 2034)    Mobility walks Low fall risk   Focused Assessments    R Recommendations: See Admitting Provider Note  Report given to:   Additional Notes:

## 2018-11-12 NOTE — Anesthesia Preprocedure Evaluation (Deleted)
Anesthesia Evaluation  Patient identified by MRN, date of birth, ID band Patient awake    Reviewed: Allergy & Precautions, NPO status , Patient's Chart, lab work & pertinent test results  Airway        Dental   Pulmonary           Cardiovascular      Neuro/Psych    GI/Hepatic   Endo/Other    Renal/GU      Musculoskeletal   Abdominal   Peds  Hematology   Anesthesia Other Findings   Reproductive/Obstetrics                             Anesthesia Physical Anesthesia Plan Anesthesia Quick Evaluation  

## 2018-11-12 NOTE — Op Note (Signed)
11/11/2018 - 11/12/2018  3:04 PM  PATIENT:  Miranda Valentine  63 y.o. female  PRE-OPERATIVE DIAGNOSIS:  Cholelithiasis with cholecystitis  POST-OPERATIVE DIAGNOSIS:  Cholelithiasis with cholecystitis  PROCEDURE:  Procedure(s): LAPAROSCOPIC CHOLECYSTECTOMY   SURGEON:  Surgeon(s) and Role:    * Jovita Kussmaul, MD - Primary  PHYSICIAN ASSISTANT:   ASSISTANTS: Saverio Danker, PA   ANESTHESIA:   local and general  EBL:  50 mL   BLOOD ADMINISTERED:none  DRAINS: (1) Jackson-Pratt drain(s) with closed bulb suction in the gallbladder bed of liver   LOCAL MEDICATIONS USED:  MARCAINE     SPECIMEN:  Source of Specimen:  gallbladder  DISPOSITION OF SPECIMEN:  PATHOLOGY  COUNTS:  YES  TOURNIQUET:  * No tourniquets in log *  DICTATION: .Dragon Dictation     Procedure: After informed consent was obtained the patient was brought to the operating room and placed in the supine position on the operating room table. After adequate induction of general anesthesia the patient's abdomen was prepped with ChloraPrep allowed to dry and draped in usual sterile manner. An appropriate timeout was performed. The area below the umbilicus was infiltrated with quarter percent  Marcaine. A small incision was made with a 15 blade knife. The incision was carried down through the subcutaneous tissue bluntly with a hemostat and [employer] retractors. The linea alba was identified. The linea alba was incised with a 15 blade knife and each side was grasped with Coker clamps. The preperitoneal space was then probed with a hemostat until the peritoneum was opened and access was gained to the abdominal cavity. A 0 Vicryl pursestring stitch was placed in the fascia surrounding the opening. A Hassan cannula was then placed through the opening and anchored in place with the previously placed Vicryl purse string stitch. The abdomen was insufflated with carbon dioxide without difficulty. A laparoscope was inserted  through the [facility] cannula in the right upper quadrant was inspected. Next the epigastric region was infiltrated with % Marcaine. A small incision was made with a 15 blade knife. A 5 mm port was placed bluntly through this incision into the abdominal cavity under direct vision. Next 2 sites were chosen laterally on the right side of the abdomen for placement of 5 mm ports. Each of these areas was infiltrated with quarter percent Marcaine. Small stab incisions were made with a 15 blade knife. 5 mm ports were then placed bluntly through these incisions into the abdominal cavity under direct vision without difficulty. A blunt grasper was placed through the lateralmost 5 mm port and used to grasp the dome of the gallbladder and elevated anteriorly and superiorly.  The gallbladder was severely inflamed and had to be aspirated in order to grasp.  A moderate amount of pus was evacuated.  Another blunt grasper was placed through the other 5 mm port and used to retract the body and neck of the gallbladder. A dissector was placed through the epigastric port and using the electrocautery the peritoneal reflection at the gallbladder neck was opened. Blunt dissection was then carried out in this area until the gallbladder neck-cystic duct junction was readily identified and a good window was created.  The base of the gallbladder and cystic duct appeared to friable to get a cholangiogram for fear of tearing the cystic duct.  A single clip was placed on the gallbladder neck.  The anatomy was well seen and a good critical window was created.  2 clips were placed proximally on the cystic duct and  the duct was divided between the 2 sets of clips. Posterior to this the cystic artery was identified and again dissected bluntly in a circumferential manner until a good window  was created. 2 clips were placed proximally and one distally on the artery and the artery was divided between the 2 sets of clips. Next a laparoscopic hook cautery  device was used to separate the gallbladder from the liver bed. Prior to completely detaching the gallbladder from the liver bed the liver bed was inspected and several small bleeding points were coagulated with the electrocautery until the area was completely hemostatic.  A piece of Surgicel was placed in the liver bed to help with hemostasis.  The gallbladder was then detached the rest of it from the liver bed without difficulty. A laparoscopic bag was inserted through the hassan port. The laparoscope was moved to the epigastric port. The gallbladder was placed within the bag and the bag was sealed.  The bag with the gallbladder was then removed with the [facility] cannula through the infraumbilical port without difficulty.  A 19 French round Blake drain was then brought through the epigastric port and out the lateralmost port.  The drain was placed in the gallbladder bed of the liver.  The drain was anchored to the skin with a 3-0 nylon stitch.  The fascial defect was then closed with the previously placed Vicryl pursestring stitch as well as with another figure-of-eight 0 Vicryl stitch. The liver bed was inspected again and found to be hemostatic. The abdomen was irrigated with copious amounts of saline until the effluent was clear. The ports were then removed under direct vision without difficulty and were found to be hemostatic. The gas was allowed to escape.  The drain was placed to bulb suction and there was a good seal.  The skin incisions were all closed with interrupted 4-0 Monocryl subcuticular stitches. Dermabond dressings were applied. The patient tolerated the procedure well. At the end of the case all needle sponge and instrument counts were correct. The patient was then awakened and taken to recovery in stable condition  PLAN OF CARE: Admit for overnight observation  PATIENT DISPOSITION:  PACU - hemodynamically stable.   Delay start of Pharmacological VTE agent (>24hrs) due to surgical blood loss  or risk of bleeding: no

## 2018-11-12 NOTE — Discharge Instructions (Signed)
[facility]  Please arrive at least 30 min before your appointment to complete your check in paperwork.  If you are unable to arrive 30 min prior to your appointment time we may have to cancel or reschedule you. LAPAROSCOPIC SURGERY: POST OP INSTRUCTIONS Always review your discharge instruction sheet given to you by the facility where your surgery was performed. IF YOU HAVE DISABILITY OR FAMILY LEAVE FORMS, YOU MUST BRING THEM TO THE OFFICE FOR PROCESSING.   DO NOT GIVE THEM TO YOUR DOCTOR.  PAIN CONTROL  1. First take acetaminophen (Tylenol) AND/or ibuprofen (Advil) to control your pain after surgery.  Follow directions on package.  Taking acetaminophen (Tylenol) and/or ibuprofen (Advil) regularly after surgery will help to control your pain and lower the amount of prescription pain medication you may need.  You should not take more than 4,000 mg (4 grams) of acetaminophen (Tylenol) in 24 hours.  You should not take ibuprofen (Advil), aleve, motrin, naprosyn or other NSAIDS if you have a history of stomach ulcers or chronic kidney disease.  2. A prescription for pain medication may be given to you upon discharge.  Take your pain medication as prescribed, if you still have uncontrolled pain after taking acetaminophen (Tylenol) or ibuprofen (Advil). 3. Use ice packs to help control pain. 4. If you need a refill on your pain medication, please contact your pharmacy.  They will contact our office to request authorization. Prescriptions will not be filled after 5pm or on week-ends.  HOME MEDICATIONS 5. Take your usually prescribed medications unless otherwise directed.  DIET 6. You should follow a light diet the first few days after arrival home.  Be sure to include lots of fluids daily. Avoid fatty, fried foods.   CONSTIPATION 7. It is common to experience some constipation after surgery and if you are taking pain medication.  Increasing fluid intake and taking a stool  softener (such as Colace) will usually help or prevent this problem from occurring.  A mild laxative (Milk of Magnesia or Miralax) should be taken according to package instructions if there are no bowel movements after 48 hours.  WOUND/INCISION CARE 8. Most patients will experience some swelling and bruising in the area of the incisions.  Ice packs will help.  Swelling and bruising can take several days to resolve.  9. Unless discharge instructions indicate otherwise, follow guidelines below  a. STERI-STRIPS - you may remove your outer bandages 48 hours after surgery, and you may shower at that time.  You have steri-strips (small skin tapes) in place directly over the incision.  These strips should be left on the skin for 7-10 days.   b. DERMABOND/SKIN GLUE - you may shower in 24 hours.  The glue will flake off over the next 2-3 weeks. 10. Any sutures or staples will be removed at the office during your follow-up visit.  ACTIVITIES 11. You may resume regular (light) daily activities beginning the next day--such as daily self-care, walking, climbing stairs--gradually increasing activities as tolerated.  You may have sexual intercourse when it is comfortable.  Refrain from any heavy lifting or straining until approved by your doctor. a. You may drive when you are no longer taking prescription pain medication, you can comfortably wear a seatbelt, and you can safely maneuver your car and apply brakes.  FOLLOW-UP 12. You should see your doctor in the office for a follow-up appointment approximately 2-3 weeks after your surgery.  You should have been given your post-op/follow-up appointment when   your surgery was scheduled.  If you did not receive a post-op/follow-up appointment, make sure that you call for this appointment within a day or two after you arrive home to insure a convenient appointment time.   WHEN TO CALL YOUR DOCTOR: 1. Fever over 101.0 2. Inability to urinate 3. Continued bleeding from  incision. 4. Increased pain, redness, or drainage from the incision. 5. Increasing abdominal pain  The clinic staff is available to answer your questions during regular business hours.  Please don't hesitate to call and ask to speak to one of the nurses for clinical concerns.  If you have a medical emergency, go to the nearest emergency room or call 911.  A surgeon from [facility] is always on call at the hospital. [address], [address], [place], [place]  [address] ? [address] [address], [place], [place]   [address] [phone] ? [phone] ? FAX [phone]  .........   Managing Your Pain After Surgery Without Opioids    Thank you for participating in our program to help patients manage their pain after surgery without opioids. This is part of our effort to provide you with the best care possible, without exposing you or your family to the risk that opioids pose.  What pain can I expect after surgery? You can expect to have some pain after surgery. This is normal. The pain is typically worse the day after surgery, and quickly begins to get better. Many studies have found that many patients are able to manage their pain after surgery with Over-the-Counter (OTC) medications such as Tylenol and Motrin. If you have a condition that does not allow you to take Tylenol or Motrin, notify your surgical team.  How will I manage my pain? The best strategy for controlling your pain after surgery is around the clock pain control with Tylenol (acetaminophen) and Motrin (ibuprofen or Advil). Alternating these medications with each other allows you to maximize your pain control. In addition to Tylenol and Motrin, you can use heating pads or ice packs on your incisions to help reduce your pain.  How will I alternate your regular strength over-the-counter pain medication? You will take a dose of pain medication every three hours. ; Start by taking 650 mg of Tylenol (2 pills of 325  mg) ; 3 hours later take 600 mg of Motrin (3 pills of 200 mg) ; 3 hours after taking the Motrin take 650 mg of Tylenol ; 3 hours after that take 600 mg of Motrin.   - 1 -  See example - if your first dose of Tylenol is at 12:00 PM   12:00 PM Tylenol 650 mg (2 pills of 325 mg)  3:00 PM Motrin 600 mg (3 pills of 200 mg)  6:00 PM Tylenol 650 mg (2 pills of 325 mg)  9:00 PM Motrin 600 mg (3 pills of 200 mg)  Continue alternating every 3 hours   We recommend that you follow this schedule around-the-clock for at least 3 days after surgery, or until you feel that it is no longer needed. Use the table on the last page of this handout to keep track of the medications you are taking. Important: Do not take more than 3000mg of Tylenol or 3200mg of Motrin in a 24-hour period. Do not take ibuprofen/Motrin if you have a history of bleeding stomach ulcers, severe kidney disease, &/or actively taking a blood thinner  What if I still have pain? If you have pain that is not   controlled with the over-the-counter pain medications (Tylenol and Motrin or Advil) you might have what we call "breakthrough" pain. You will receive a prescription for a small amount of an opioid pain medication such as Oxycodone, Tramadol, or Tylenol with Codeine. Use these opioid pills in the first 24 hours after surgery if you have breakthrough pain. Do not take more than 1 pill every 4-6 hours.  If you still have uncontrolled pain after using all opioid pills, don't hesitate to call our staff using the number provided. We will help make sure you are managing your pain in the best way possible, and if necessary, we can provide a prescription for additional pain medication.   Day 1    Time  Name of Medication Number of pills taken  Amount of Acetaminophen  Pain Level   Comments  AM PM       AM PM       AM PM       AM PM       AM PM       AM PM       AM PM       AM PM       Total Daily amount of Acetaminophen Do not  take more than  3,000 mg per day      Day 2    Time  Name of Medication Number of pills taken  Amount of Acetaminophen  Pain Level   Comments  AM PM       AM PM       AM PM       AM PM       AM PM       AM PM       AM PM       AM PM       Total Daily amount of Acetaminophen Do not take more than  3,000 mg per day      Day 3    Time  Name of Medication Number of pills taken  Amount of Acetaminophen  Pain Level   Comments  AM PM       AM PM       AM PM       AM PM          AM PM       AM PM       AM PM       AM PM       Total Daily amount of Acetaminophen Do not take more than  3,000 mg per day      Day 4    Time  Name of Medication Number of pills taken  Amount of Acetaminophen  Pain Level   Comments  AM PM       AM PM       AM PM       AM PM       AM PM       AM PM       AM PM       AM PM       Total Daily amount of Acetaminophen Do not take more than  3,000 mg per day      Day 5    Time  Name of Medication Number of pills taken  Amount of Acetaminophen  Pain Level   Comments  AM PM       AM PM       AM   PM       AM PM       AM PM       AM PM       AM PM       AM PM       Total Daily amount of Acetaminophen Do not take more than  3,000 mg per day       Day 6    Time  Name of Medication Number of pills taken  Amount of Acetaminophen  Pain Level  Comments  AM PM       AM PM       AM PM       AM PM       AM PM       AM PM       AM PM       AM PM       Total Daily amount of Acetaminophen Do not take more than  3,000 mg per day      Day 7    Time  Name of Medication Number of pills taken  Amount of Acetaminophen  Pain Level   Comments  AM PM       AM PM       AM PM       AM PM       AM PM       AM PM       AM PM       AM PM       Total Daily amount of Acetaminophen Do not take more than  3,000 mg per day        For additional information about how and where to safely dispose of unused  opioid medications - https://www.morepowerfulnc.org  Disclaimer: This document contains information and/or instructional materials adapted from [facility] for the typical patient with your condition. It does not replace medical advice from your health care provider because your experience may differ from that of the typical patient. Talk to your health care provider if you have any questions about this document, your condition or your treatment plan. Adapted from [facility]   

## 2018-11-13 ENCOUNTER — Encounter (HOSPITAL_COMMUNITY): Payer: Self-pay | Admitting: General Surgery

## 2018-11-13 MED ORDER — PROMETHAZINE HCL 25 MG PO TABS: 25.0000 mg | ORAL_TABLET | Freq: Four times a day (QID) | ORAL | 0 refills | Status: DC | PRN

## 2018-11-13 MED ORDER — AMOXICILLIN-POT CLAVULANATE 875-125 MG PO TABS: 1.0000 | ORAL_TABLET | Freq: Two times a day (BID) | ORAL | 0 refills | Status: AC

## 2018-11-13 MED ORDER — HYDROCODONE-ACETAMINOPHEN 5-325 MG PO TABS: 1.0000 | ORAL_TABLET | Freq: Four times a day (QID) | ORAL | 0 refills | Status: DC | PRN

## 2018-11-13 NOTE — Progress Notes (Signed)
Pt was discharged home today. Instructions were reviewed with patient, and questions were answered. Pt was taken to main entrance via wheelchair by NT.  

## 2018-11-13 NOTE — Discharge Summary (Signed)
Physician Discharge Summary [facility]  Patient ID: Miranda Valentine MRN: [id number] DOB/AGE: [birth year] 63 y.o.  Admit date: 11/11/2018 Discharge date: 11/13/2018  Admission Diagnoses:  Acute cholecystitis  Discharge Diagnoses:  Active Problems:   Acute cholecystitis   Discharged Condition: good  Hospital Course: Patient was admitted for observation following gallbladder surgery.  Post op course was uncomplicated.  Pain was well controlled.  Tolerated diet.  Patient was prepared for discharge home on POD#1.  Consults: None  Treatments: surgery: laparoscopic cholecystectomy, drain placement  Discharge Exam: Blood pressure (!) 117/55, pulse (!) 59, temperature 97.9 F (36.6 C), temperature source Oral, resp. rate 16, height 5\' 3"  (1.6 m), weight 69.4 kg, last menstrual period 01/28/1984, SpO2 100 %. HEENT - clear Neck - soft Chest - clear bilaterally Cor - RRR Abd - soft without distension; wounds dry and intact; drain with serosanguinous output  Disposition: Home  Discharge Instructions    Care order/instruction   Complete by: As directed    Instruct patient in drain care prior to discharge home today.   Diet - low sodium heart healthy   Complete by: As directed    Discharge instructions   Complete by: As directed    [facility]  LAPAROSCOPIC SURGERY:  POST-OP INSTRUCTIONS  Always review your discharge instruction sheet given to you by the facility where your surgery was performed.  A prescription for pain medication may be given to you upon discharge.  Take your pain medication as prescribed.  If narcotic pain medicine is not needed, then you may take acetaminophen (Tylenol) or ibuprofen (Advil) as needed.  Take your usually prescribed medications unless otherwise directed.  If you need a refill on your pain medication, please contact your pharmacy.  They will contact our office to request authorization.  Prescriptions will not be filled after 5 P.M. or on weekends.  You should follow a light diet the first few days after arrival home, such as soup and crackers or toast.  Be sure to include plenty of fluids daily.  Most patients will experience some swelling and bruising in the area of the incisions.  Ice packs will help.  Swelling and bruising can take several days to resolve.   It is common to experience some constipation after surgery.  Increasing fluid intake and taking a stool softener (such as Colace) will usually help or prevent this problem from occurring.  A mild laxative (Milk of Magnesia or Miralax) should be taken according to package instructions if there has been no bowel movement after 48 hours.  You will likely have Dermabond (topical glue) over your incisions.  This seals the incisions and allows you to bathe and shower at any time after your surgery.  Glue should remain in place for up to 10 days.  It may be removed after 10 days by pealing off the Dermabond material or using Vaseline or naval jelly to remove.  If you have steri-strips over your incisions, you may remove the gauze bandage on the second day after surgery, and you may shower at that time.  Leave your steri-strips (small skin tapes) in place directly over the incision.  These strips should remain on the skin for 5-7 days and then be removed.  You may get them wet in the shower and pat them dry.  Any sutures or staples will be removed at the office during your follow-up visit.  ACTIVITIES:  You may resume regular (light) daily activities beginning the  next day - such as daily self-care, walking, climbing stairs - gradually increasing activities as tolerated.  You may have sexual intercourse when it is comfortable.  Refrain from any heavy lifting or straining until approved by your doctor.  You may drive when you are no longer taking prescription pain medication, when you can comfortably wear a seatbelt, and when you can  safely maneuver your car and apply brakes.  You should see your doctor in the office for a follow-up appointment approximately 2-3 weeks after your surgery.  Make sure that you call for this appointment within a day or two after you arrive home to insure a convenient appointment time.  WHEN TO CALL YOUR DOCTOR: Fever over 101.0 Inability to urinate Continued bleeding from incision Increased pain, redness, or drainage from the incision Increasing abdominal pain  The clinic staff is available to answer your questions during regular business hours.  Please don't hesitate to call and ask to speak to one of the nurses for clinical concerns.  If you have a medical emergency, go to the nearest emergency room or call 911.  [facility] is always on call for the hospital.  Velora Hecklerodd M. Esmond Hinch, MD, [facility] Office: [phone] Toll Free:  [phone] FAX [phone]  Website: [email or web address]   Discharge wound care:   Complete by: As directed    May shower.  Change dressing at drain site as needed with dry gauze and tape.  Drain care as instructed.  Record output and bring to appointment at [facility] office.   Increase activity slowly   Complete by: As directed      Allergies as of 11/13/2018   No Known Allergies     Medication List    TAKE these medications   amoxicillin-clavulanate 875-125 MG tablet Commonly known as: Augmentin Take 1 tablet by mouth every 12 (twelve) hours for 5 days.   azelastine 0.1 % nasal spray Commonly known as: ASTELIN USE 1 TO 2 SPRAYS IN EACH NOSTRIL TWICE DAILY What changed: See the new instructions.   Biotin 1 MG Caps Take by mouth at bedtime.   cetirizine 10 MG tablet Commonly known as: ZYRTEC Take 10 mg by mouth at bedtime.   estradiol 0.5 MG tablet Commonly known as: ESTRACE TAKE 1 TABLET(0.5 MG) BY MOUTH DAILY What changed: See the new instructions.   fluticasone 50 MCG/ACT  nasal spray Commonly known as: FLONASE Place 2 sprays into both nostrils at bedtime.   HYDROcodone-acetaminophen 5-325 MG tablet Commonly known as: NORCO/VICODIN Take 1 tablet by mouth every 6 (six) hours as needed for moderate pain.   magnesium 30 MG tablet Take 30 mg by mouth at bedtime.   montelukast 10 MG tablet Commonly known as: SINGULAIR Take 1 tablet by mouth at bedtime.   multivitamins ther. w/minerals Tabs tablet Take 1 tablet by mouth at bedtime.   Pristiq 50 MG 24 hr tablet Generic drug: desvenlafaxine Take 50 mg by mouth daily.   promethazine 25 MG tablet Commonly known as: PHENERGAN Take 1 tablet (25 mg total) by mouth every 6 (six) hours as needed for nausea.   Vitamin D (Ergocalciferol) 1.25 MG (50000 UT) Caps capsule Commonly known as: DRISDOL TAKE 1 CAPSULE BY MOUTH EVERY 7 DAYS What changed:   how much to take  how to take this  when to take this  additional instructions   zaleplon 10 MG capsule Commonly known as: SONATA Take 1 capsule by mouth as needed.  Discharge Care Instructions  (From admission, onward)         Start     Ordered   11/13/18 0000  Discharge wound care:    Comments: May shower.  Change dressing at drain site as needed with dry gauze and tape.  Drain care as instructed.  Record output and bring to appointment at [facility] office.   11/13/18 0847         Follow-up Information    [facility] Follow up on 12/07/2018.   Specialty: General Surgery Why: 8:30am, arrive by 8:00am for paperwork and check in.  please bring insurance card and photo ID Contact information: [address] [place] [address] [phone]           Velora Heckler, MD, [facility] Office: [phone]   Signed: Darnell Level 11/13/2018, 8:47 AM

## 2018-11-15 LAB — SURGICAL PATHOLOGY

## 2018-11-16 ENCOUNTER — Other Ambulatory Visit: Payer: BC Managed Care – PPO

## 2019-01-18 ENCOUNTER — Other Ambulatory Visit: Payer: Self-pay | Admitting: Obstetrics & Gynecology

## 2019-01-18 DIAGNOSIS — Z1231 Encounter for screening mammogram for malignant neoplasm of breast: Secondary | ICD-10-CM

## 2019-02-08 ENCOUNTER — Other Ambulatory Visit: Payer: Self-pay

## 2019-02-08 NOTE — Progress Notes (Signed)
64 y.o. G1P1 Married White or Caucasian female here for annual exam.  Had gallbladder removed in October.  She spent the night in the hospital.  This was done by Dr. Marlou Starks.  Has done her follow up.  Denies vaginal bleeding.     Had fever a couple of weeks ago that lasted for two days.  She had testing for Covid, flu, pneumonia.  Had epigastric pain and nipple pain with it.  Urine was darker.  Did have urine culture done.  Still waiting on results.  Patient's last menstrual period was 01/28/1984.          Sexually active: Yes.    The current method of family planning is status post hysterectomy and post menopausal status.    Exercising: Yes.    Walking at home Smoker:  no  Health Maintenance: Pap:  07/18/16 neg  History of abnormal Pap:  no MMG: 02/15/2018  BIRADS1:Neg scheduled 03/07/19 Colonoscopy:  09/28/15 polyps. F/u 5 years  BMD:   12/19/15 osteopenia  TDaP:  2018 Pneumonia vaccine(s):  No Shingrix: Complete Hep C testing: Has donated blood Screening Labs: here today    reports that she has never smoked. She has never used smokeless tobacco. She reports current alcohol use of about 5.0 standard drinks of alcohol per week. She reports that she does not use drugs.  Past Medical History:  Diagnosis Date  . Anginal pain ([place]) 11/07/2011  . Asthma   . Cholecystitis   . Depression   . Migraines   . Osteopenia   . Ovarian cyst    benign  . 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 ABDOMINAL HYSTERECTOMY  1982   TAH/RSO  . CHOLECYSTECTOMY N/A 11/12/2018   Procedure: LAPAROSCOPIC CHOLECYSTECTOMY WITH INTRAOPERATIVE CHOLANGIOGRAM;  Surgeon: Jovita Kussmaul, MD;  Location: [facility];  Service: General;  Laterality: N/A;  . LSO  1981  . TONSILLECTOMY AND ADENOIDECTOMY  1997    Current Outpatient Medications  Medication Sig Dispense Refill  . azelastine (ASTELIN) 0.1 % nasal spray USE 1 TO 2 SPRAYS IN EACH NOSTRIL TWICE DAILY (Patient  taking differently: Place 1-2 sprays into both nostrils 2 (two) times daily as needed for rhinitis or allergies. ) 30 mL 5  . Biotin 1 MG CAPS Take by mouth at bedtime.     . cetirizine (ZYRTEC) 10 MG tablet Take 10 mg by mouth at bedtime.     Marland Kitchen estradiol (ESTRACE) 0.5 MG tablet TAKE 1 TABLET(0.5 MG) BY MOUTH DAILY (Patient taking differently: Take 0.5 mg by mouth at bedtime. ) 90 tablet 1  . fluticasone (FLONASE) 50 MCG/ACT nasal spray Place 2 sprays into both nostrils at bedtime.     Marland Kitchen HYDROcodone-acetaminophen (NORCO/VICODIN) 5-325 MG tablet Take 1 tablet by mouth every 6 (six) hours as needed for moderate pain. 15 tablet 0  . magnesium 30 MG tablet Take 30 mg by mouth at bedtime.     . montelukast (SINGULAIR) 10 MG tablet Take 1 tablet by mouth at bedtime.   1  . Multiple Vitamins-Minerals (MULTIVITAMINS THER. W/MINERALS) TABS Take 1 tablet by mouth at bedtime.     Marland Kitchen PRISTIQ 50 MG 24 hr tablet Take 50 mg by mouth daily.   0  . promethazine (PHENERGAN) 25 MG tablet Take 1 tablet (25 mg total) by mouth every 6 (six) hours as needed for nausea. 6 tablet 0  . Vitamin D, Ergocalciferol, (DRISDOL) 50000 units CAPS  capsule TAKE 1 CAPSULE BY MOUTH EVERY 7 DAYS (Patient taking differently: Take 50,000 Units by mouth every 14 (fourteen) days. ) 12 capsule 4  . zaleplon (SONATA) 10 MG capsule Take 1 capsule by mouth as needed.  5   No current facility-administered medications for this visit.    Family History  Problem Relation Age of Onset  . Cancer Mother        unclear primary-mets  . Hypertension Father   . CVA Maternal Grandmother   . CVA Maternal Grandfather   . Parkinson's disease Maternal Grandfather   . CVA Paternal Grandfather   . Migraines Sister     Review of Systems  All other systems reviewed and are negative.   Exam:   Vitals:   02/10/19 0803  BP: 100/60  Pulse: 66  Temp: (!) 96.8 F (36 C)    General appearance: alert, cooperative and appears stated age Head:  Normocephalic, without obvious abnormality, atraumatic Neck: no adenopathy, supple, symmetrical, trachea midline and thyroid normal to inspection and palpation Lungs: clear to auscultation bilaterally Breasts: normal appearance, no masses or tenderness Heart: regular rate and rhythm Abdomen: soft, non-tender; bowel sounds normal; no masses,  no organomegaly Extremities: extremities normal, atraumatic, no cyanosis or edema Skin: Skin color, texture, turgor normal. No rashes or lesions Lymph nodes: Cervical, supraclavicular, and axillary nodes normal. No abnormal inguinal nodes palpated Neurologic: Grossly normal   Pelvic: External genitalia:  no lesions              Urethra:  normal appearing urethra with no masses, tenderness or lesions              Bartholins and Skenes: normal                 Vagina: normal appearing vagina with normal color and discharge, no lesions              Cervix: absent              Pap taken: No. Bimanual Exam:  Uterus:  uterus absent              Adnexa: no mass, fullness, tenderness               Rectovaginal: Confirms               Anus:  normal sphincter tone, no lesions  Chaperone, Zenovia Jordan, CMA, was present for exam.  A:  Well Woman with normal exam H/O TAH/RSO, later LSO, on low dosed HRT Osteopenia with BMD 11/17 H/O migraines, stable  P:   Mammogram guidelines reviewed.   pap smear not indicated RF for estrace 0.5mg  daily.  #90/4RF.   CBC, CMP, lipids, Vit D Vit D 50K weekly.  #12/4RF Colonoscopy due 2022 BMD order placed for 2021 return annually or prn

## 2019-02-10 ENCOUNTER — Other Ambulatory Visit: Payer: Self-pay

## 2019-02-10 ENCOUNTER — Ambulatory Visit (INDEPENDENT_AMBULATORY_CARE_PROVIDER_SITE_OTHER): Payer: BC Managed Care – PPO | Admitting: Obstetrics & Gynecology

## 2019-02-10 ENCOUNTER — Encounter: Payer: Self-pay | Admitting: Obstetrics & Gynecology

## 2019-02-10 VITALS — BP 100/60 | HR 66 | Temp 96.8°F | Ht 62.0 in | Wt 148.0 lb

## 2019-02-10 DIAGNOSIS — Z01419 Encounter for gynecological examination (general) (routine) without abnormal findings: Secondary | ICD-10-CM

## 2019-02-10 DIAGNOSIS — M8580 Other specified disorders of bone density and structure, unspecified site: Secondary | ICD-10-CM | POA: Diagnosis not present

## 2019-02-10 DIAGNOSIS — Z0000 Encounter for general adult medical examination without abnormal findings: Secondary | ICD-10-CM

## 2019-02-10 MED ORDER — VITAMIN D (ERGOCALCIFEROL) 1.25 MG (50000 UNIT) PO CAPS: 50000.0000 [IU] | ORAL_CAPSULE | ORAL | 4 refills | Status: DC

## 2019-02-10 MED ORDER — ESTRADIOL 0.5 MG PO TABS: 0.5000 mg | ORAL_TABLET | Freq: Every day | ORAL | 4 refills | Status: DC

## 2019-02-10 NOTE — Patient Instructions (Signed)
The [facility] location [address] [place], [place] [address]  [place]: [phone] Fax: [phone] Hours (M-F): 8am - [facility] [address] . [place], [place]: [phone]  Dr. Betty Swaziland, Abbe Amsterdam PA--Corey Nafzinger

## 2019-02-11 LAB — COMPREHENSIVE METABOLIC PANEL
ALT: 100 IU/L — ABNORMAL HIGH (ref 0–32)
AST: 50 IU/L — ABNORMAL HIGH (ref 0–40)
Albumin/Globulin Ratio: 1.8 (ref 1.2–2.2)
Albumin: 4.4 g/dL (ref 3.8–4.8)
Alkaline Phosphatase: 204 IU/L — ABNORMAL HIGH (ref 39–117)
BUN/Creatinine Ratio: 19 (ref 12–28)
BUN: 12 mg/dL (ref 8–27)
Bilirubin Total: 0.6 mg/dL (ref 0.0–1.2)
CO2: 23 mmol/L (ref 20–29)
Calcium: 9.4 mg/dL (ref 8.7–10.3)
Chloride: 101 mmol/L (ref 96–106)
Creatinine, Ser: 0.64 mg/dL (ref 0.57–1.00)
GFR calc Af Amer: 110 mL/min/{1.73_m2} (ref >59)
GFR calc non Af Amer: 95 mL/min/{1.73_m2} (ref >59)
Globulin, Total: 2.5 g/dL (ref 1.5–4.5)
Glucose: 84 mg/dL (ref 65–99)
Potassium: 4.4 mmol/L (ref 3.5–5.2)
Sodium: 138 mmol/L (ref 134–144)
Total Protein: 6.9 g/dL (ref 6.0–8.5)

## 2019-02-11 LAB — CBC
Hematocrit: 38.1 % (ref 34.0–46.6)
Hemoglobin: 12.4 g/dL (ref 11.1–15.9)
MCH: 29 pg (ref 26.6–33.0)
MCHC: 32.5 g/dL (ref 31.5–35.7)
MCV: 89 fL (ref 79–97)
Platelets: 425 10*3/uL (ref 150–450)
RBC: 4.28 x10E6/uL (ref 3.77–5.28)
RDW: 12.7 % (ref 11.7–15.4)
WBC: 6.2 10*3/uL (ref 3.4–10.8)

## 2019-02-11 LAB — VITAMIN D 25 HYDROXY (VIT D DEFICIENCY, FRACTURES): Vit D, 25-Hydroxy: 52.2 ng/mL (ref 30.0–100.0)

## 2019-02-11 LAB — LIPID PANEL
Chol/HDL Ratio: 3.4 ratio (ref 0.0–4.4)
Cholesterol, Total: 209 mg/dL — ABNORMAL HIGH (ref 100–199)
HDL: 62 mg/dL (ref >39)
LDL Chol Calc (NIH): 117 mg/dL — ABNORMAL HIGH (ref 0–99)
Triglycerides: 170 mg/dL — ABNORMAL HIGH (ref 0–149)
VLDL Cholesterol Cal: 30 mg/dL (ref 5–40)

## 2019-03-07 ENCOUNTER — Ambulatory Visit
Admission: RE | Admit: 2019-03-07 | Discharge: 2019-03-07 | Disposition: A | Discharge status: Home / Self Care | Payer: BC Managed Care – PPO | Source: Ambulatory Visit | Attending: Obstetrics & Gynecology | Admitting: Obstetrics & Gynecology

## 2019-03-07 ENCOUNTER — Other Ambulatory Visit: Payer: Self-pay

## 2019-03-07 DIAGNOSIS — Z1231 Encounter for screening mammogram for malignant neoplasm of breast: Secondary | ICD-10-CM

## 2019-03-11 ENCOUNTER — Ambulatory Visit: Payer: BC Managed Care – PPO | Attending: Internal Medicine

## 2019-03-11 DIAGNOSIS — Z23 Encounter for immunization: Secondary | ICD-10-CM | POA: Insufficient documentation

## 2019-03-11 NOTE — Progress Notes (Signed)
   Covid-19 Vaccination Clinic  Name:  Miranda Valentine    MRN: [id number] DOB: 10/20/1955  03/11/2019  Miranda Valentine was observed post Covid-19 immunization for 15 minutes without incidence. She was provided with Vaccine Information Sheet and instruction to access the V-Safe system.   Miranda Valentine was instructed to call 911 with any severe reactions post vaccine: Marland Kitchen Difficulty breathing  . Swelling of your face and throat  . A fast heartbeat  . A bad rash all over your body  . Dizziness and weakness    Immunizations Administered    Name Date Dose VIS Date Route   Pfizer COVID-19 Vaccine 03/11/2019  9:58 AM 0.3 mL 01/07/2019 Intramuscular   Manufacturer: [employer], [employer]   Lot: [id number]   NDC: [address]-1

## 2019-04-03 ENCOUNTER — Ambulatory Visit: Payer: BC Managed Care – PPO | Attending: Internal Medicine

## 2019-04-03 DIAGNOSIS — Z23 Encounter for immunization: Secondary | ICD-10-CM | POA: Insufficient documentation

## 2019-04-03 NOTE — Progress Notes (Signed)
   Covid-19 Vaccination Clinic  Name:  Miranda Valentine    MRN: [id number] DOB: 01/23/1956  04/03/2019  Miranda Valentine was observed post Covid-19 immunization for 15 minutes without incident. She was provided with Vaccine Information Sheet and instruction to access the V-Safe system.   Miranda Valentine was instructed to call 911 with any severe reactions post vaccine: Marland Kitchen Difficulty breathing  . Swelling of face and throat  . A fast heartbeat  . A bad rash all over body  . Dizziness and weakness   Immunizations Administered    Name Date Dose VIS Date Route   Pfizer COVID-19 Vaccine 04/03/2019  9:56 AM 0.3 mL 01/07/2019 Intramuscular   Manufacturer: [employer], [employer]   Lot: [id number]   NDC: [address]-8

## 2019-11-11 ENCOUNTER — Ambulatory Visit: Payer: BC Managed Care – PPO

## 2019-12-27 ENCOUNTER — Telehealth: Payer: Self-pay

## 2019-12-27 NOTE — Telephone Encounter (Addendum)
Spoke with pt. Pt states not sure if has enough Rx for Estrace tablets and Vit D to get to next AEX in March 2022 with Dr Hyacinth Meeker.  Pt states has recently changed insurances and needs 90 supply for Rx. Pt advised original Rx in 01/2019 by Dr Hyacinth Meeker was sent as 90 day supply to [facility] and should have enough Rx left to get through to March. Pt plans to transfer care to [facility].  Pt advised to call [facility] and update insurance and return call to our office if needs new Rx sent. Pt agreeable and thankful for advice. Encounter closed

## 2019-12-27 NOTE — Telephone Encounter (Signed)
Patient is calling to request prescription changes.

## 2020-02-06 ENCOUNTER — Other Ambulatory Visit: Payer: Self-pay | Admitting: Obstetrics & Gynecology

## 2020-02-06 DIAGNOSIS — Z1231 Encounter for screening mammogram for malignant neoplasm of breast: Secondary | ICD-10-CM

## 2020-03-22 ENCOUNTER — Ambulatory Visit
Admission: RE | Admit: 2020-03-22 | Discharge: 2020-03-22 | Disposition: A | Discharge status: Home / Self Care | Payer: Managed Care, Other (non HMO) | Source: Ambulatory Visit | Attending: Obstetrics & Gynecology | Admitting: Obstetrics & Gynecology

## 2020-03-22 ENCOUNTER — Other Ambulatory Visit: Payer: Self-pay

## 2020-03-22 ENCOUNTER — Other Ambulatory Visit: Payer: Self-pay | Admitting: Obstetrics & Gynecology

## 2020-03-22 DIAGNOSIS — Z1231 Encounter for screening mammogram for malignant neoplasm of breast: Secondary | ICD-10-CM

## 2020-03-26 ENCOUNTER — Other Ambulatory Visit (HOSPITAL_BASED_OUTPATIENT_CLINIC_OR_DEPARTMENT_OTHER): Payer: Self-pay | Admitting: Obstetrics & Gynecology

## 2020-03-26 ENCOUNTER — Telehealth (HOSPITAL_BASED_OUTPATIENT_CLINIC_OR_DEPARTMENT_OTHER): Payer: Self-pay | Admitting: *Deleted

## 2020-03-26 DIAGNOSIS — M85852 Other specified disorders of bone density and structure, left thigh: Secondary | ICD-10-CM

## 2020-03-26 NOTE — Telephone Encounter (Signed)
Pharmacy notified to contact Dr.Miller to refill Estradiol 0.5mg  #90 tablets. Patient has appointment with Dr.Miller 06-04-21 at Dr.Miller's new office . Phone #[phone], fax #[phone].

## 2020-03-26 NOTE — Telephone Encounter (Signed)
DOB verified. Informed pt that new order for bone density test was placed as the last one had expired. Pt verbalized understanding.

## 2020-04-02 ENCOUNTER — Other Ambulatory Visit (HOSPITAL_BASED_OUTPATIENT_CLINIC_OR_DEPARTMENT_OTHER): Payer: Self-pay | Admitting: Obstetrics & Gynecology

## 2020-04-02 DIAGNOSIS — M85852 Other specified disorders of bone density and structure, left thigh: Secondary | ICD-10-CM

## 2020-04-04 ENCOUNTER — Other Ambulatory Visit: Payer: Self-pay | Admitting: *Deleted

## 2020-04-04 MED ORDER — VITAMIN D (ERGOCALCIFEROL) 1.25 MG (50000 UNIT) PO CAPS: 50000.0000 [IU] | ORAL_CAPSULE | ORAL | 1 refills | Status: DC

## 2020-04-04 MED ORDER — ESTRADIOL 0.5 MG PO TABS: 0.5000 mg | ORAL_TABLET | Freq: Every day | ORAL | 1 refills | Status: DC

## 2020-04-04 NOTE — Telephone Encounter (Signed)
Refill requests received from [facility] via fax for Estradiol 0.5 mg and Vitamin D2 50,000 IU.

## 2020-04-26 ENCOUNTER — Ambulatory Visit: Payer: BC Managed Care – PPO | Admitting: Obstetrics & Gynecology

## 2020-06-01 ENCOUNTER — Encounter (HOSPITAL_BASED_OUTPATIENT_CLINIC_OR_DEPARTMENT_OTHER): Payer: Self-pay | Admitting: Obstetrics & Gynecology

## 2020-06-01 ENCOUNTER — Other Ambulatory Visit (HOSPITAL_BASED_OUTPATIENT_CLINIC_OR_DEPARTMENT_OTHER)
Admission: RE | Admit: 2020-06-01 | Discharge: 2020-06-01 | Disposition: A | Discharge status: Home / Self Care | Payer: Managed Care, Other (non HMO) | Source: Ambulatory Visit | Attending: Obstetrics & Gynecology | Admitting: Obstetrics & Gynecology

## 2020-06-01 ENCOUNTER — Ambulatory Visit (INDEPENDENT_AMBULATORY_CARE_PROVIDER_SITE_OTHER): Payer: Managed Care, Other (non HMO) | Admitting: Obstetrics & Gynecology

## 2020-06-01 ENCOUNTER — Other Ambulatory Visit: Payer: Self-pay

## 2020-06-01 VITALS — BP 120/78 | HR 64 | Ht 62.5 in | Wt 149.2 lb

## 2020-06-01 DIAGNOSIS — Z1211 Encounter for screening for malignant neoplasm of colon: Secondary | ICD-10-CM | POA: Diagnosis not present

## 2020-06-01 DIAGNOSIS — R748 Abnormal levels of other serum enzymes: Secondary | ICD-10-CM | POA: Diagnosis not present

## 2020-06-01 DIAGNOSIS — M130 Polyarthritis, unspecified: Secondary | ICD-10-CM | POA: Diagnosis not present

## 2020-06-01 DIAGNOSIS — Z01419 Encounter for gynecological examination (general) (routine) without abnormal findings: Secondary | ICD-10-CM

## 2020-06-01 DIAGNOSIS — M85852 Other specified disorders of bone density and structure, left thigh: Secondary | ICD-10-CM

## 2020-06-01 DIAGNOSIS — E2839 Other primary ovarian failure: Secondary | ICD-10-CM

## 2020-06-01 DIAGNOSIS — Z79890 Hormone replacement therapy (postmenopausal): Secondary | ICD-10-CM

## 2020-06-01 LAB — COMPREHENSIVE METABOLIC PANEL
ALT: 18 U/L (ref 0–44)
AST: 18 U/L (ref 15–41)
Albumin: 4.6 g/dL (ref 3.5–5.0)
Alkaline Phosphatase: 75 U/L (ref 38–126)
Anion gap: 9 (ref 5–15)
BUN: 12 mg/dL (ref 8–23)
CO2: 28 mmol/L (ref 22–32)
Calcium: 9.4 mg/dL (ref 8.9–10.3)
Chloride: 99 mmol/L (ref 98–111)
Creatinine, Ser: 0.66 mg/dL (ref 0.44–1.00)
GFR, Estimated: >60 mL/min (ref >60)
Glucose, Bld: 87 mg/dL (ref 70–99)
Potassium: 3.9 mmol/L (ref 3.5–5.1)
Sodium: 136 mmol/L (ref 135–145)
Total Bilirubin: 0.5 mg/dL (ref 0.3–1.2)
Total Protein: 7.6 g/dL (ref 6.5–8.1)

## 2020-06-01 LAB — CBC WITH DIFFERENTIAL/PLATELET
Abs Immature Granulocytes: 0.01 10*3/uL (ref 0.00–0.07)
Basophils Absolute: 0 10*3/uL (ref 0.0–0.1)
Basophils Relative: 1 %
Eosinophils Absolute: 0 10*3/uL (ref 0.0–0.5)
Eosinophils Relative: 1 %
HCT: 41.8 % (ref 36.0–46.0)
Hemoglobin: 13.5 g/dL (ref 12.0–15.0)
Immature Granulocytes: 0 %
Lymphocytes Relative: 42 %
Lymphs Abs: 1.7 10*3/uL (ref 0.7–4.0)
MCH: 29 pg (ref 26.0–34.0)
MCHC: 32.3 g/dL (ref 30.0–36.0)
MCV: 89.7 fL (ref 80.0–100.0)
Monocytes Absolute: 0.3 10*3/uL (ref 0.1–1.0)
Monocytes Relative: 6 %
Neutro Abs: 2 10*3/uL (ref 1.7–7.7)
Neutrophils Relative %: 50 %
Platelets: 310 10*3/uL (ref 150–400)
RBC: 4.66 MIL/uL (ref 3.87–5.11)
RDW: 12.7 % (ref 11.5–15.5)
WBC: 4 10*3/uL (ref 4.0–10.5)
nRBC: 0 % (ref 0.0–0.2)

## 2020-06-01 LAB — SEDIMENTATION RATE: Sed Rate: 13 mm/hr (ref 0–22)

## 2020-06-01 MED ORDER — ESTRADIOL 0.5 MG PO TABS: 0.5000 mg | ORAL_TABLET | Freq: Every day | ORAL | 4 refills | Status: DC

## 2020-06-01 MED ORDER — VITAMIN D (ERGOCALCIFEROL) 1.25 MG (50000 UNIT) PO CAPS: 50000.0000 [IU] | ORAL_CAPSULE | ORAL | 4 refills | Status: DC

## 2020-06-01 NOTE — Progress Notes (Signed)
65 y.o. G1P1 Married White or Caucasian female here for annual exam.  Had elevated liver enzymes last year.  Repeat was still elevated.  So, she saw GI and was advised to stop any alcohol intake and recheck the level.  She decreased intake over three months.  Recheck 3 months.     Has noticed more feet and hand pain.  Granddaughter was diagnosed with Rett syndrome.  She is lifting her granddaughter a lot more.  PCP:  Dr. Merri Brunette  Patient's last menstrual period was 01/28/1984.          Sexually active: Yes.    The current method of family planning is post menopausal status.    Exercising: Yes.    Smoker:  no  Health Maintenance: Pap:  Not indicated History of abnormal Pap:  no MMG:  03/22/2020 Colonoscopy:  2017 BMD:   scheduled TDaP:  06/2016 Pneumonia vaccine(s):  Discussed with pt Shingrix:   completed Hep C testing: has donated blood Screening Labs: will obtain today   reports that she has never smoked. She has never used smokeless tobacco. She reports current alcohol use of about 5.0 standard drinks of alcohol per week. She reports that she does not use drugs.  Past Medical History:  Diagnosis Date  . Anginal pain ([facility]) 11/07/2011  . Asthma   . Cholecystitis   . Depression   . Dry eye   . Migraines   . Osteopenia   . Ovarian cyst    benign  . 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 ABDOMINAL HYSTERECTOMY  1982   TAH/RSO  . CHOLECYSTECTOMY N/A 11/12/2018   Procedure: LAPAROSCOPIC CHOLECYSTECTOMY WITH INTRAOPERATIVE CHOLANGIOGRAM;  Surgeon: Griselda Miner, MD;  Location: [facility];  Service: General;  Laterality: N/A;  . LSO  1981  . TONSILLECTOMY AND ADENOIDECTOMY  1997    Current Outpatient Medications  Medication Sig Dispense Refill  . azelastine (ASTELIN) 0.1 % nasal spray USE 1 TO 2 SPRAYS IN EACH NOSTRIL TWICE DAILY (Patient taking differently: Place 1-2 sprays into both nostrils 2 (two) times daily as needed  for rhinitis or allergies.) 30 mL 5  . Biotin 1 MG CAPS Take by mouth at bedtime.     . cetirizine (ZYRTEC) 10 MG tablet Take 10 mg by mouth at bedtime.     Marland Kitchen estradiol (ESTRACE) 0.5 MG tablet Take 1 tablet (0.5 mg total) by mouth at bedtime. 90 tablet 1  . fluticasone (FLONASE) 50 MCG/ACT nasal spray Place 2 sprays into both nostrils at bedtime.     . Multiple Vitamins-Minerals (MULTIVITAMINS THER. W/MINERALS) TABS Take 1 tablet by mouth at bedtime.     Marland Kitchen PRISTIQ 100 MG 24 hr tablet Take 100 mg by mouth every morning.    . Vitamin D, Ergocalciferol, (DRISDOL) 1.25 MG (50000 UNIT) CAPS capsule Take 1 capsule (50,000 Units total) by mouth every 14 (fourteen) days. 6 capsule 1  . XIIDRA 5 % SOLN ADMINISTER 1 DROP BOTH EYES TWICE DAILY    . zaleplon (SONATA) 10 MG capsule Take 1 capsule by mouth as needed.  5   No current facility-administered medications for this visit.    Family History  Problem Relation Age of Onset  . Cancer Mother        unclear primary-mets  . Hypertension Father   . CVA Maternal Grandmother   . CVA Maternal Grandfather   . Parkinson's disease Maternal Grandfather   .  CVA Paternal Grandfather   . Migraines Sister     Review of Systems  Exam:   BP 120/78   Pulse 64   Ht 5' 2.5" (1.588 m)   Wt 149 lb 3.2 oz (67.7 kg)   LMP 01/28/1984   BMI 26.85 kg/m   Height: 5' 2.5" (158.8 cm)  General appearance: alert, cooperative and appears stated age Head: Normocephalic, without obvious abnormality, atraumatic Neck: no adenopathy, supple, symmetrical, trachea midline and thyroid normal to inspection and palpation Lungs: clear to auscultation bilaterally Breasts: normal appearance, no masses or tenderness Heart: regular rate and rhythm Abdomen: soft, non-tender; bowel sounds normal; no masses,  no organomegaly Extremities: extremities normal, atraumatic, no cyanosis or edema Skin: Skin color, texture, turgor normal. No rashes or lesions Lymph nodes: Cervical,  supraclavicular, and axillary nodes normal. No abnormal inguinal nodes palpated Neurologic: Grossly normal   Pelvic: External genitalia:  no lesions              Urethra:  normal appearing urethra with no masses, tenderness or lesions              Bartholins and Skenes: normal                 Vagina: normal appearing vagina with normal color and no discharge, no lesions              Cervix: absent              Pap taken: No. Bimanual Exam:  Uterus:  uterus absent              Adnexa: normal adnexa and no mass, fullness, tenderness               Rectovaginal: Confirms               Anus:  normal sphincter tone, no lesions  Chaperone, Ina Homes, CMA, was present for exam.  Assessment/Plan: 1. Well woman exam with routine gynecological exam - Pap not indicated - MMG 03/22/2020 - BMD scheduled - colonoscopy 2017 - vaccines updated - lab work ordered today  2. Polyarthritis - ANA; Future - Rheumatoid factor; Future - CBC w/Diff; Future - Sedimentation rate; Future  3. Elevated liver enzymes - Comprehensive metabolic panel; Future  4. Colon cancer screening - Ambulatory referral to Gastroenterology  5. Hypoestrogenism - DG BONE DENSITY (DXA); Future  6. Postmenopausal HRT (hormone replacement therapy) - estradiol (ESTRACE) 0.5 MG tablet; Take 1 tablet (0.5 mg total) by mouth at bedtime.  Dispense: 90 tablet; Refill: 4  7. Osteopenia of neck of left femur - Vitamin D, Ergocalciferol, (DRISDOL) 1.25 MG (50000 UNIT) CAPS capsule; Take 1 capsule (50,000 Units total) by mouth every 14 (fourteen) days.  Dispense: 6 capsule; Refill: 4

## 2020-06-02 LAB — ANA: Anti Nuclear Antibody (ANA): NEGATIVE

## 2020-06-02 LAB — RHEUMATOID FACTOR: Rheumatoid fact SerPl-aCnc: 11.1 IU/mL (ref <14.0)

## 2020-06-03 ENCOUNTER — Encounter (HOSPITAL_BASED_OUTPATIENT_CLINIC_OR_DEPARTMENT_OTHER): Payer: Self-pay | Admitting: Obstetrics & Gynecology

## 2020-06-04 ENCOUNTER — Ambulatory Visit (HOSPITAL_BASED_OUTPATIENT_CLINIC_OR_DEPARTMENT_OTHER)
Admission: RE | Admit: 2020-06-04 | Discharge: 2020-06-04 | Disposition: A | Discharge status: Home / Self Care | Payer: Managed Care, Other (non HMO) | Source: Ambulatory Visit | Attending: Obstetrics & Gynecology | Admitting: Obstetrics & Gynecology

## 2020-06-04 ENCOUNTER — Other Ambulatory Visit: Payer: Self-pay

## 2020-06-04 DIAGNOSIS — Z13820 Encounter for screening for osteoporosis: Secondary | ICD-10-CM | POA: Insufficient documentation

## 2020-06-04 DIAGNOSIS — E2839 Other primary ovarian failure: Secondary | ICD-10-CM | POA: Insufficient documentation

## 2020-06-04 DIAGNOSIS — M8580 Other specified disorders of bone density and structure, unspecified site: Secondary | ICD-10-CM | POA: Insufficient documentation

## 2020-06-04 DIAGNOSIS — Z8262 Family history of osteoporosis: Secondary | ICD-10-CM | POA: Diagnosis not present

## 2020-06-06 ENCOUNTER — Other Ambulatory Visit (HOSPITAL_BASED_OUTPATIENT_CLINIC_OR_DEPARTMENT_OTHER): Payer: Self-pay | Admitting: Obstetrics & Gynecology

## 2020-06-06 DIAGNOSIS — M2550 Pain in unspecified joint: Secondary | ICD-10-CM

## 2020-06-06 DIAGNOSIS — M130 Polyarthritis, unspecified: Secondary | ICD-10-CM

## 2020-06-08 ENCOUNTER — Encounter (HOSPITAL_BASED_OUTPATIENT_CLINIC_OR_DEPARTMENT_OTHER): Payer: Self-pay

## 2020-07-12 ENCOUNTER — Ambulatory Visit: Payer: Self-pay

## 2020-07-12 ENCOUNTER — Ambulatory Visit: Payer: Managed Care, Other (non HMO) | Admitting: Internal Medicine

## 2020-07-12 ENCOUNTER — Other Ambulatory Visit: Payer: Self-pay

## 2020-07-12 ENCOUNTER — Encounter: Payer: Self-pay | Admitting: Internal Medicine

## 2020-07-12 VITALS — BP 122/74 | HR 74 | Resp 14 | Ht 62.0 in | Wt 150.8 lb

## 2020-07-12 DIAGNOSIS — M79671 Pain in right foot: Secondary | ICD-10-CM | POA: Insufficient documentation

## 2020-07-12 DIAGNOSIS — M79672 Pain in left foot: Secondary | ICD-10-CM | POA: Diagnosis not present

## 2020-07-12 DIAGNOSIS — M79642 Pain in left hand: Secondary | ICD-10-CM | POA: Insufficient documentation

## 2020-07-12 DIAGNOSIS — M79641 Pain in right hand: Secondary | ICD-10-CM | POA: Insufficient documentation

## 2020-07-12 NOTE — Patient Instructions (Addendum)
De Quervain's Tenosynovitis  De Quervain's tenosynovitis is a condition that causes inflammation of the tendon on the thumb side of the wrist. Tendons are cords of tissue that connect bones to muscles. The tendons in the hand pass through a tunnel called a sheath. A slippery layer of tissue (synovium) lets the tendons move smoothly in the sheath. With de Quervain'stenosynovitis, the sheath swells or thickens, causing friction and pain. The condition is also called de Quervain's disease and de Quervain's syndrome.It occurs most often in women who are 67-53 years old. What are the causes? The exact cause of this condition is not known. It may be associated withoveruse of the hand and wrist. What increases the risk? You are more likely to develop this condition if you: Use your hands far more than normal, especially if you repeat certain movements that involve twisting your hand or using a tight grip. Are pregnant. Are a middle-aged woman. Have rheumatoid arthritis. Have diabetes. What are the signs or symptoms? The main symptom of this condition is pain on the thumb side of the wrist. The pain may get worse when you grasp something or turn your wrist. Other symptoms may include: Pain that extends up the forearm. Swelling of your wrist and hand. Trouble moving the thumb and wrist. A sensation of snapping in the wrist. A bump filled with fluid (cyst) in the area of the pain. How is this diagnosed? This condition may be diagnosed based on: Your symptoms and medical history. A physical exam. During the exam, your health care provider may do a simple test Lourena Simmonds test) that involves pulling your thumb and wrist to see if this causes pain. You may also need to have an X-ray or ultrasound. How is this treated? Treatment for this condition may include: Avoiding any activity that causes pain and swelling. Taking medicines. Anti-inflammatory medicines and corticosteroid injections may be used  to reduce inflammation and relieve pain. Wearing a splint. Having surgery. This may be needed if other treatments do not work. Once the pain and swelling have gone down, you may start: Physical therapy. This includes exercises to improve movement and strength in your wrist and thumb. Occupational therapy. This includes adjusting how you move your wrist. Follow these instructions at home: If you have a splint: Wear the splint as told by your health care provider. Remove it only as told by your health care provider. Loosen the splint if your fingers tingle, become numb, or turn cold and blue. Keep the splint clean. If the splint is not waterproof: Do not let it get wet. Cover it with a watertight covering when you take a bath or a shower. Managing pain, stiffness, and swelling  Avoid movements and activities that cause pain and swelling in the wrist area. If directed, put ice on the painful area. This may be helpful after doing activities that involve the sore wrist. To do this: Put ice in a plastic bag. Place a towel between your skin and the bag. Leave the ice on for 20 minutes, 2-3 times a day. Remove the ice if your skin turns bright red. This is very important. If you cannot feel pain, heat, or cold, you have a greater risk of damage to the area. Move your fingers often to reduce stiffness and swelling. Raise (elevate) the injured area above the level of your heart while you are sitting or lying down.  General instructions Return to your normal activities as told by your health care provider. Ask your health care  provider what activities are safe for you. Take over-the-counter and prescription medicines only as told by your health care provider. Keep all follow-up visits. This is important. Contact a health care provider if: Your pain medicine does not help. Your pain gets worse. You develop new symptoms. Summary De Quervain's tenosynovitis is a condition that causes inflammation  of the tendon on the thumb side of the wrist. The condition occurs most often in women who are 60-44 years old. The exact cause of this condition is not known. It may be associated with overuse of the hand and wrist. Treatment starts with avoiding activity that causes pain or swelling in the wrist area. Other treatments may include wearing a splint and taking medicine. Sometimes, surgery is needed.

## 2020-07-12 NOTE — Progress Notes (Signed)
 Office Visit Note  Patient: Miranda Valentine             Date of Birth: 09/06/1955           MRN: [id number]             PCP: Smith, Candace, MD Referring: Miller, Mary S, MD Visit Date: 07/12/2020 Occupation: Retired medical administrative staff  Subjective:  Other (Bilateral hand and foot pain, patient denies swelling. Onset in February. Patient does report family history of RA in her mother. )   History of Present Illness: Miranda Valentine is a 65 y.o. female here for evaluation of joint pain in her bilateral hands and feet. This started around February of this year previously was feeling in good health. First she felt some increase in sharp pain at the base of her heels and though this was related to increased use and walking. She then noticed pain at the base of her thumbs, and also developed pain in the MCP joints of both hands. She did not experience any swelling redness or joint stiffness. She does have a history of hand pain almost 20 years ago she saw Dr. Truslow for this with xrays of both hands states diagnosis of osteoarthritis at the time but never required any specific treatments or long term management and symptoms improved on their own.  Labs reviewed ANA neg RF neg ESR 13 CBC wnl CMP wnl  Imaging reviewed 05/2020 Bone density scan consistent with osteopenia  Activities of Daily Living:  Patient reports morning stiffness for 0 minutes.   Patient Denies nocturnal pain.  Difficulty dressing/grooming: Denies Difficulty climbing stairs: Denies Difficulty getting out of chair: Denies Difficulty using hands for taps, buttons, cutlery, and/or writing: Reports  Review of Systems  Constitutional:  Negative for fatigue.  HENT:  Positive for mouth dryness and nose dryness. Negative for mouth sores.   Eyes:  Positive for dryness. Negative for pain and itching.  Respiratory:  Negative for shortness of breath and difficulty breathing.   Cardiovascular:  Negative  for chest pain and palpitations.  Gastrointestinal:  Negative for blood in stool, constipation and diarrhea.  Endocrine: Negative for increased urination.  Genitourinary:  Negative for difficulty urinating.  Musculoskeletal:  Positive for joint pain, joint pain, myalgias, muscle tenderness and myalgias. Negative for joint swelling and morning stiffness.  Skin:  Negative for color change, rash, hair loss and redness.  Allergic/Immunologic: Positive for susceptible to infections.  Neurological:  Negative for dizziness, numbness, headaches, memory loss and weakness.  Hematological:  Positive for bruising/bleeding tendency.  Psychiatric/Behavioral:  Negative for confusion.    PMFS History:  Patient Active Problem List   Diagnosis Date Noted   Bilateral hand pain 07/12/2020   Heel pain, bilateral 07/12/2020   Near syncope 09/17/2013   Chest pain, localized 11/07/2011   Family history of coronary arteriosclerosis 11/07/2011   Migraine headache history 11/07/2011    Past Medical History:  Diagnosis Date   Anginal pain (HCC) 11/07/2011   Asthma    Cholecystitis    Depression    Dry eye    Migraines    Osteopenia    Ovarian cyst    benign   Positive TB test    skin test    Family History  Problem Relation Age of Onset   Cancer Mother        unclear primary-mets   Rheum arthritis Mother    Hypertension Father    Other Sister          rheumatoid factor positive   CVA Maternal Grandmother    CVA Maternal Grandfather    Parkinson's disease Maternal Grandfather    CVA Paternal Grandfather    Healthy Son    Past Surgical History:  Procedure Laterality Date   ABDOMINAL HYSTERECTOMY  1982   TAH/RSO   CHOLECYSTECTOMY N/A 11/12/2018   Procedure: LAPAROSCOPIC CHOLECYSTECTOMY WITH INTRAOPERATIVE CHOLANGIOGRAM;  Surgeon: Jovita Kussmaul, MD;  Location: [facility];  Service: General;  Laterality: N/A;   [place]   Social History   Social History  Narrative   Not on file   Immunization History  Administered Date(s) Administered   Influenza Split 12/23/2013   Influenza, High Dose Seasonal PF 10/23/2016, 10/23/2017, 03/01/2020   Influenza,inj,Quad PF,6+ Mos 12/06/2016   Influenza-Unspecified 11/17/2018, 11/10/2019   PFIZER(Purple Top)SARS-COV-2 Vaccination 03/11/2019, 04/03/2019, 07/27/2019   Tdap 07/18/2016   Zoster Recombinat (Shingrix) 10/16/2017, 01/22/2018     Objective: Vital Signs: BP 122/74 (BP Location: Right Arm, Patient Position: Sitting, Cuff Size: Normal)   Pulse 74   Resp 14   Ht 5' 2" (1.575 m)   Wt 150 lb 12.8 oz (68.4 kg)   LMP 01/28/1984   BMI 27.58 kg/m    Physical Exam HENT:     Right Ear: External ear normal.     Left Ear: External ear normal.  Skin:    General: Skin is warm and dry.     Findings: No rash.  Neurological:     General: No focal deficit present.     Mental Status: She is alert.  Psychiatric:        Mood and Affect: Mood normal.     Musculoskeletal Exam:  Neck full ROM no tenderness Shoulders full ROM no tenderness or swelling Elbows full ROM no tenderness or swelling Wrists full ROM no tenderness or swelling, mild tenderness to pressure over radial aspect of wrist and base of thumb Fingers full ROM no tenderness or swelling Pace and FABER maneuver provoke left lateral and posterior hip pain Knees full ROM no tenderness or swelling Ankles full ROM, no plantar or achilles insertion tenderness or swelling, although has bilateral positive heel squeeze tenderness MTPs full ROM no tenderness or swelling    Investigation: No additional findings.  Imaging: XR Hand 2 View Left  Result Date: 07/12/2020 X-ray left hand 2 views Radiocarpal joint space appears normal.  There are degenerative changes and probable slight joint subluxation at first [facility] with some MCP joint hyperextension.  MCP and PIP joints appear normal.  There is slight increased subchondral sclerosis at DIPs with  preserved joint space and no osteophyte formation.  No erosions or bone demineralization is seen. Impression Osteoarthritis of the first [facility] joint with minimal changes elsewhere and no evidence of inflammatory disease  XR Hand 2 View Right  Result Date: 07/12/2020 X-ray right hand 2 views Radiocarpal carpal joint space appear normal.  MCP joints appear normal possible small third joint space narrowing and PIP joint narrowing.  DIPs appear fairly preserved.  No osteophytes.  No erosions or demineralization changes are seen. Impression Very mild osteoarthritis joint changes no evidence of inflammatory disease   Recent Labs: Lab Results  Component Value Date   WBC 4.0 06/01/2020   HGB 13.5 06/01/2020   PLT 310 06/01/2020   NA 136 06/01/2020   K 3.9 06/01/2020   CL 99 06/01/2020   CO2 28 06/01/2020   GLUCOSE 87 06/01/2020   BUN 12 06/01/2020  CREATININE 0.66 06/01/2020   BILITOT 0.5 06/01/2020   ALKPHOS 75 06/01/2020   AST 18 06/01/2020   ALT 18 06/01/2020   PROT 7.6 06/01/2020   ALBUMIN 4.6 06/01/2020   CALCIUM 9.4 06/01/2020   GFRAA 110 02/10/2019    Speciality Comments: No specialty comments available.  Procedures:  No procedures performed Allergies: Patient has no known allergies.   Assessment / Plan:     Visit Diagnoses: Bilateral hand pain - Plan: XR Hand 2 View Right, XR Hand 2 View Left  Bilateral hand and wrist pain seems most consistent with tenosynovitis based on location and aggravating factors. OA changes are mild on hand xrays except moderate amount in left thumb CMC joint. I suspect dequervain's in the base of thumbs probably increased with childcare related use. Discussed this problem recommend scheduled use of low dose naproxen daily for at 2 weeks for current inflammation. If not improving with some resting and NSAIDs, can try use of splinting or f/u as needed for ultrasound inspection and possible injection.  Heel pain, bilateral  I do not see any evidence of  inflammatory disease changes in the heels. No plantar or posterior calcaneal insertion enthesitis. I recommend podiatry might be helpful to evaluate this and possible recommendations for any treatment or orthotics.  Orders: Orders Placed This Encounter  Procedures   XR Hand 2 View Right   XR Hand 2 View Left    No orders of the defined types were placed in this encounter.    Follow-Up Instructions: No follow-ups on file.   Collier Salina, MD  Note - This record has been created using [employer].  Chart creation errors have been sought, but may not always  have been located. Such creation errors do not reflect on  the standard of medical care.

## 2020-07-19 ENCOUNTER — Ambulatory Visit (INDEPENDENT_AMBULATORY_CARE_PROVIDER_SITE_OTHER): Payer: Managed Care, Other (non HMO)

## 2020-07-19 ENCOUNTER — Ambulatory Visit: Payer: Managed Care, Other (non HMO) | Admitting: Podiatry

## 2020-07-19 ENCOUNTER — Other Ambulatory Visit: Payer: Self-pay

## 2020-07-19 DIAGNOSIS — M722 Plantar fascial fibromatosis: Secondary | ICD-10-CM

## 2020-07-19 MED ORDER — TRIAMCINOLONE ACETONIDE 40 MG/ML IJ SUSP
40.0000 mg | Freq: Once | INTRAMUSCULAR | Status: AC
  Administered 2020-07-19: 40 mg

## 2020-07-19 NOTE — Patient Instructions (Signed)

## 2020-07-22 NOTE — Progress Notes (Signed)
Subjective:  Patient ID: Miranda Valentine, female    DOB: 1955-07-30,  MRN: [id number] HPI Chief Complaint  Patient presents with   Foot Pain    bil foot pain;left heel pain    65 y.o. female presents with the above complaint.   ROS: Denies fever chills nausea vomiting muscle aches pains calf pain back pain chest pain shortness of breath.  Past Medical History:  Diagnosis Date   Anginal pain (HCC) 11/07/2011   Asthma    Cholecystitis    Depression    Dry eye    Migraines    Osteopenia    Ovarian cyst    benign   Positive TB test    skin test   Past Surgical History:  Procedure Laterality Date   ABDOMINAL HYSTERECTOMY  1982   TAH/RSO   CHOLECYSTECTOMY N/A 11/12/2018   Procedure: LAPAROSCOPIC CHOLECYSTECTOMY WITH INTRAOPERATIVE CHOLANGIOGRAM;  Surgeon: Chevis Pretty III, MD;  Location: [facility];  Service: General;  Laterality: N/A;   LSO  1981   TONSILLECTOMY AND ADENOIDECTOMY  1997    Current Outpatient Medications:    azelastine (ASTELIN) 0.1 % nasal spray, USE 1 TO 2 SPRAYS IN EACH NOSTRIL TWICE DAILY (Patient taking differently: Place 1-2 sprays into both nostrils 2 (two) times daily as needed for rhinitis or allergies.), Disp: 30 mL, Rfl: 5   Biotin 1 MG CAPS, Take by mouth at bedtime. , Disp: , Rfl:    cetirizine (ZYRTEC) 10 MG tablet, Take 10 mg by mouth at bedtime. , Disp: , Rfl:    estradiol (ESTRACE) 0.5 MG tablet, Take 1 tablet (0.5 mg total) by mouth at bedtime., Disp: 90 tablet, Rfl: 4   fluticasone (FLONASE) 50 MCG/ACT nasal spray, Place 2 sprays into both nostrils at bedtime. , Disp: , Rfl:    Multiple Vitamins-Minerals (MULTIVITAMINS THER. W/MINERALS) TABS, Take 1 tablet by mouth at bedtime. , Disp: , Rfl:    PRISTIQ 100 MG 24 hr tablet, Take 100 mg by mouth every morning., Disp: , Rfl:    Vitamin D, Ergocalciferol, (DRISDOL) 1.25 MG (50000 UNIT) CAPS capsule, Take 1 capsule (50,000 Units total) by mouth every 14 (fourteen) days., Disp: 6 capsule, Rfl: 4    XIIDRA 5 % SOLN, ADMINISTER 1 DROP BOTH EYES TWICE DAILY, Disp: , Rfl:    zaleplon (SONATA) 10 MG capsule, Take 1 capsule by mouth as needed., Disp: , Rfl: 5  No Known Allergies Review of Systems Objective:  There were no vitals filed for this visit.  General: Well developed, nourished, in no acute distress, alert and oriented x3   Dermatological: Skin is warm, dry and supple bilateral. Nails x 10 are well maintained; remaining integument appears unremarkable at this time. There are no open sores, no preulcerative lesions, no rash or signs of infection present.  Vascular: Dorsalis Pedis artery and Posterior Tibial artery pedal pulses are 2/4 bilateral with immedate capillary fill time. Pedal hair growth present. No varicosities and no lower extremity edema present bilateral.   Neruologic: Grossly intact via light touch bilateral. Vibratory intact via tuning fork bilateral. Protective threshold with Semmes Wienstein monofilament intact to all pedal sites bilateral. Patellar and Achilles deep tendon reflexes 2+ bilateral. No Babinski or clonus noted bilateral.   Musculoskeletal: No gross boney pedal deformities bilateral. No pain, crepitus, or limitation noted with foot and ankle range of motion bilateral. Muscular strength 5/5 in all groups tested bilateral.  Pain on palpation medial calcaneal tubercles bilateral.  No pain on palpation medial and laterally.  Gait: Unassisted, Nonantalgic.    Radiographs:  Radiographs taken today demonstrate an osseously mature individual rectus foot type.  Soft tissue increase in density plantar fascial kidney insertion site no fractures identified.  No sick significant osseous abnormalities.  No acute findings.  Assessment & Plan:   Assessment: Most likely Planter fasciitis bilateral.  Plan: Discussed appropriate shoe gear stretching exercises ice therapy shoe gear modifications discussed etiology pathology conservative surgical therapies.  I injected  the heel today 20 mg Kenalog 5 mg Marcaine point of maximal tenderness.  And I will follow-up with her in 1 month she will follow-up with me sooner if necessary.     Miranda Valentine T. [place], [place]

## 2020-08-21 ENCOUNTER — Ambulatory Visit: Payer: Managed Care, Other (non HMO) | Admitting: Podiatry

## 2020-09-11 ENCOUNTER — Other Ambulatory Visit: Payer: Managed Care, Other (non HMO)

## 2020-09-28 ENCOUNTER — Other Ambulatory Visit (HOSPITAL_BASED_OUTPATIENT_CLINIC_OR_DEPARTMENT_OTHER): Payer: Self-pay

## 2020-09-28 DIAGNOSIS — M85852 Other specified disorders of bone density and structure, left thigh: Secondary | ICD-10-CM

## 2020-09-28 DIAGNOSIS — Z79890 Hormone replacement therapy (postmenopausal): Secondary | ICD-10-CM

## 2020-09-28 MED ORDER — ESTRADIOL 0.5 MG PO TABS: 0.5000 mg | ORAL_TABLET | Freq: Every day | ORAL | 2 refills | Status: DC

## 2020-09-28 MED ORDER — VITAMIN D (ERGOCALCIFEROL) 1.25 MG (50000 UNIT) PO CAPS: 50000.0000 [IU] | ORAL_CAPSULE | ORAL | 2 refills | Status: DC

## 2020-10-08 IMAGING — US US ABDOMEN LIMITED
1 series · 13 of 25 positions shown · non-contrast
Comparison: None.

CLINICAL DATA: Right upper quadrant pain

EXAM:
ULTRASOUND ABDOMEN LIMITED RIGHT UPPER QUADRANT

[Series 1: us abdomen limited · 0.25mm/px · 13 of 54 slices shown]
[im 1/54]
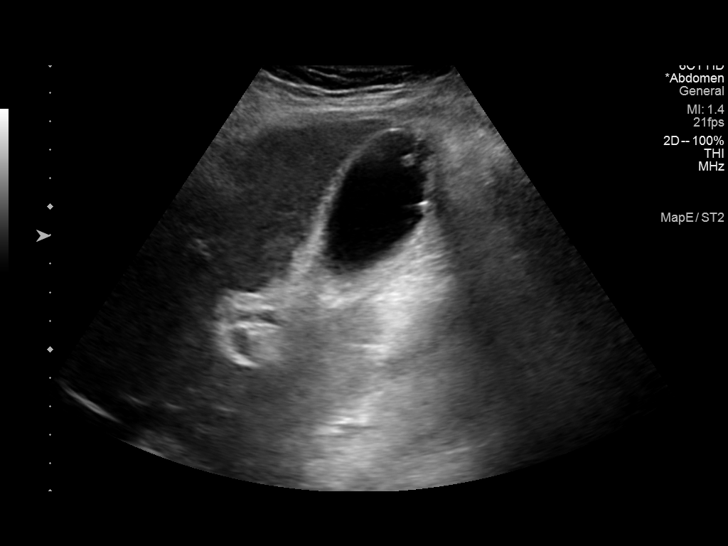
[im 5/54]
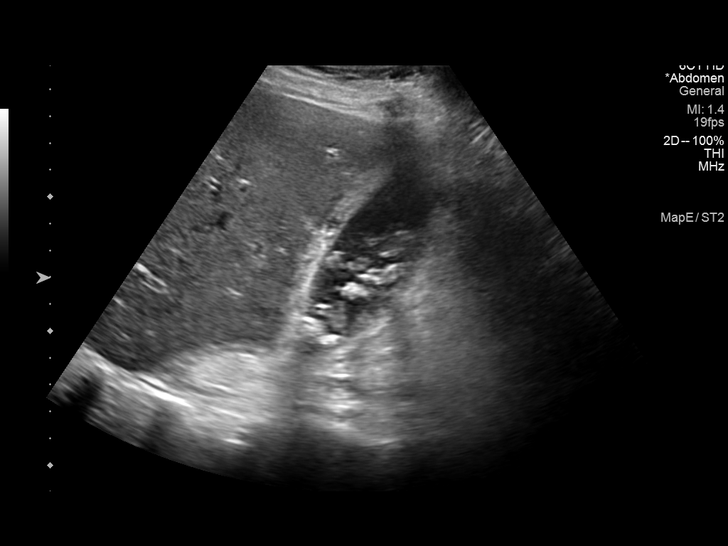
[im 9/54]
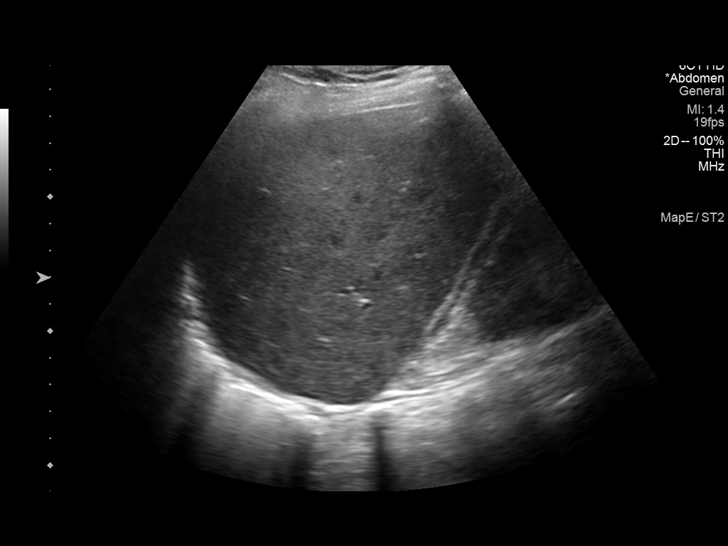
[im 14/54]
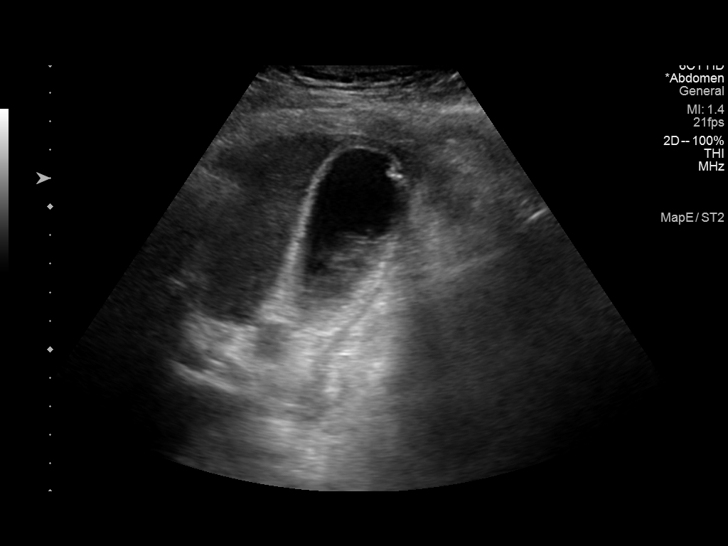
[im 18/54]
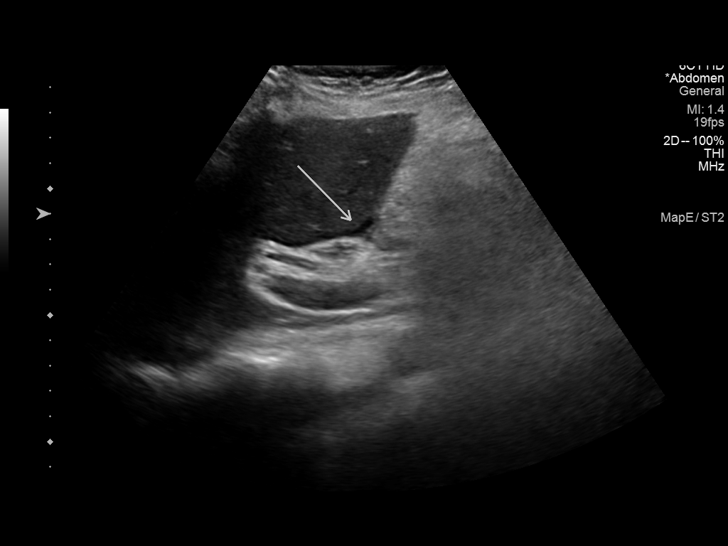
[im 23/54]
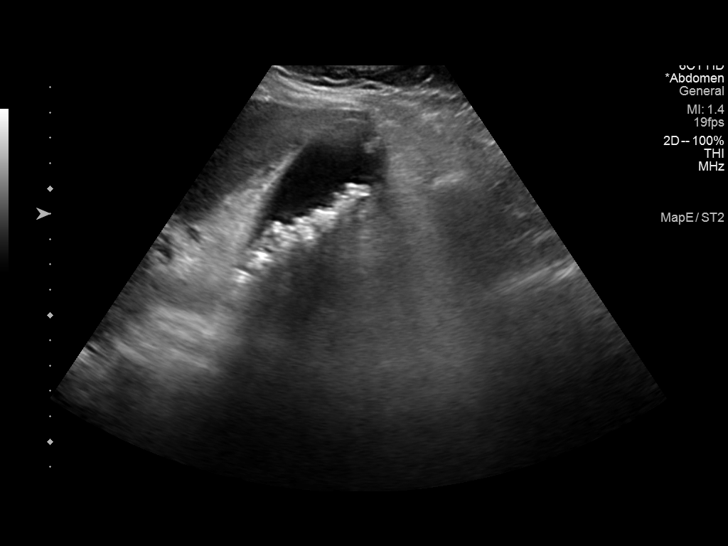
[im 27/54]
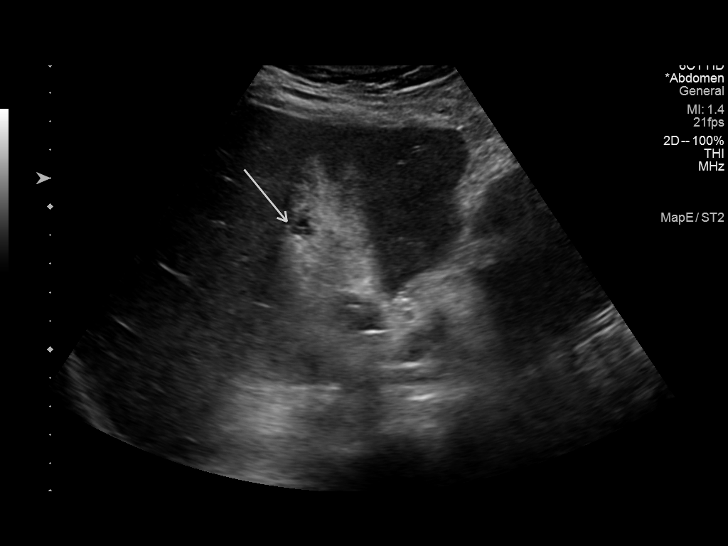
[im 31/54]
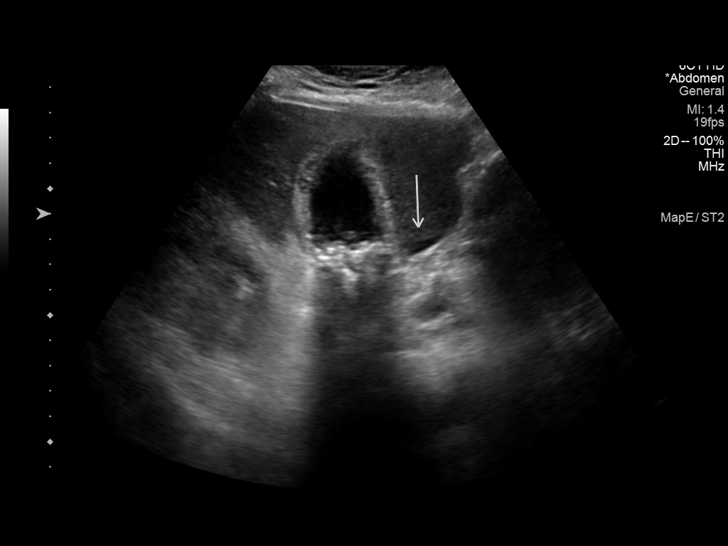
[im 36/54]
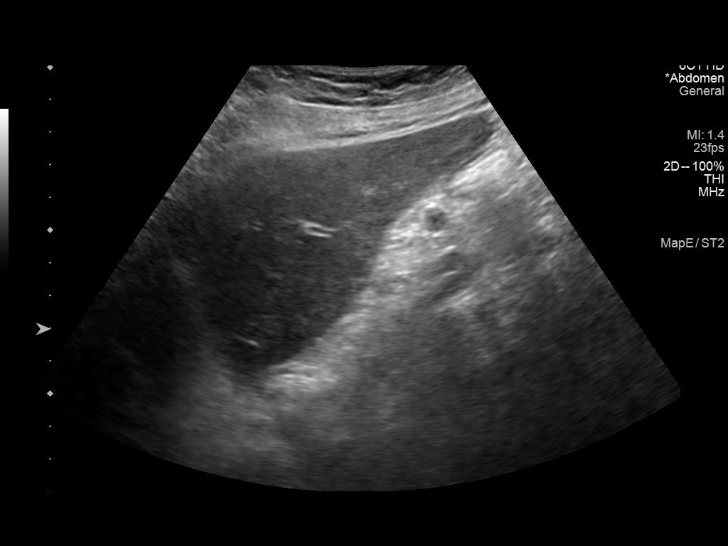
[im 40/54]
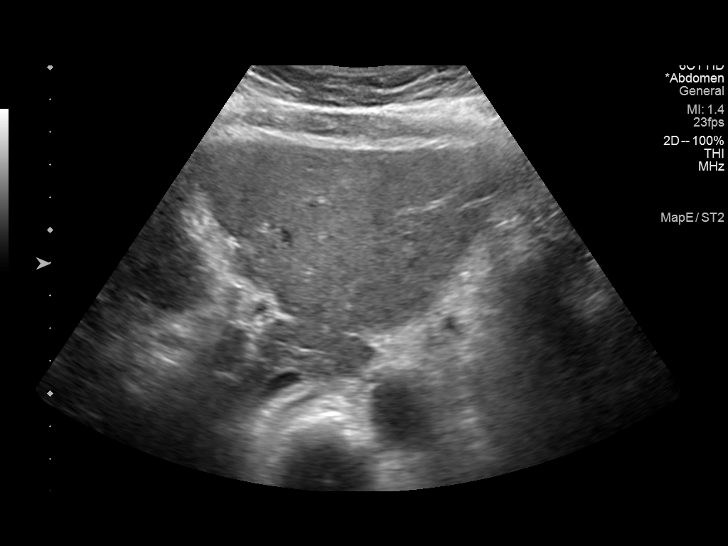
[im 45/54]
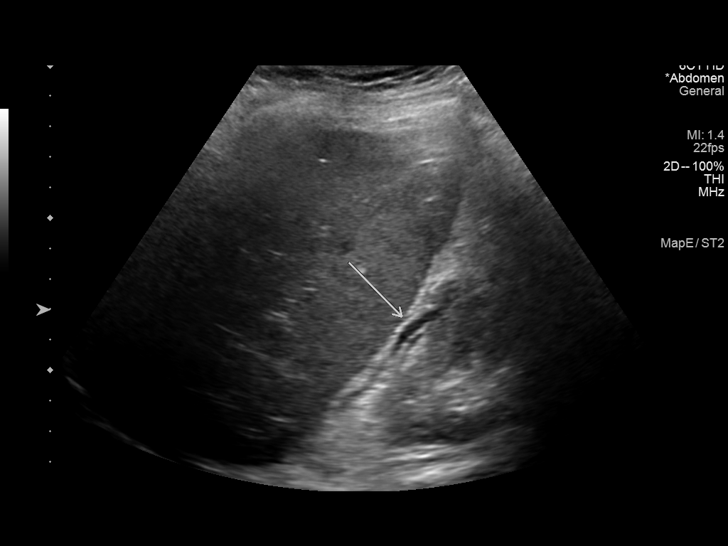
[im 49/54]
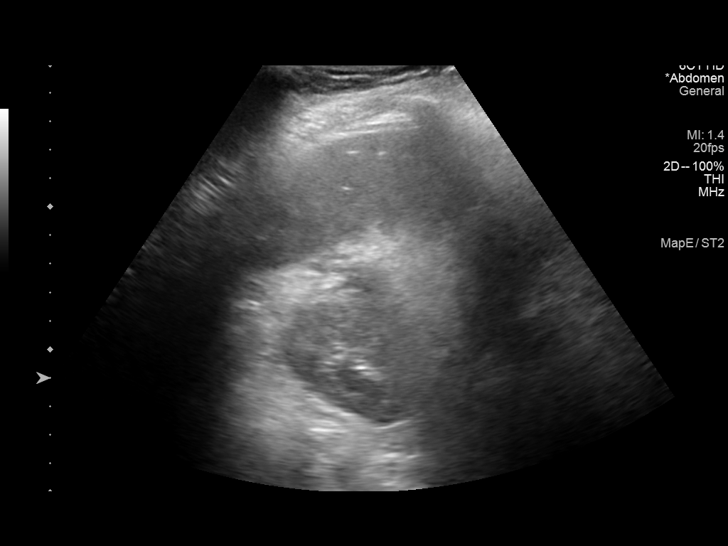
[im 54/54]
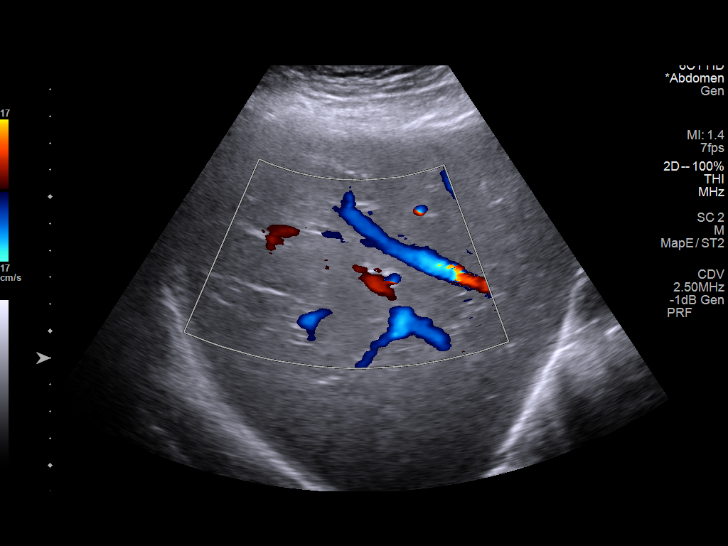

[13 of 25 positions shown; findings below may reference images not displayed]

FINDINGS: Gallbladder:

Within the gallbladder, there are multiple echogenic foci which move
and shadow consistent with cholelithiasis. Largest gallstone
measures 1.3 cm in length. Several gallstones are apparent in the
neck of the gallbladder. The gallbladder wall is thickened with mild
pericholecystic fluid and edema. The patient is tender over the
gallbladder. Patient is tender over the gallbladder.

Common bile duct:

Diameter: 4 mm. There is questionable slight intrahepatic biliary
duct dilatation. No common bile duct dilatation evident.

Liver:

No focal lesion identified. Liver echogenicity is overall increased.
Portal vein is patent on color Doppler imaging with normal direction
of blood flow towards the liver.

Other: There is trace ascites.
IMPRESSION: 1. Findings indicative of acute cholecystitis. Specifically, there
are gallstones with gallbladder wall thickening and edema. There is
slight pericholecystic fluid. Patient is focally tender over the
gallbladder.

2. Common bile duct appears normal. Equivocal intrahepatic biliary
duct dilatation.

3. Increased liver echogenicity, a finding indicative of hepatic
steatosis. No focal liver lesions are demonstrable.

4.  Trace ascites.

These results will be called to the ordering clinician or
representative by the Radiologist Assistant, and communication
documented in the PACS or zVision Dashboard.

## 2021-03-01 ENCOUNTER — Other Ambulatory Visit: Payer: Self-pay | Admitting: Obstetrics & Gynecology

## 2021-03-01 DIAGNOSIS — Z1231 Encounter for screening mammogram for malignant neoplasm of breast: Secondary | ICD-10-CM

## 2021-03-15 ENCOUNTER — Encounter (HOSPITAL_BASED_OUTPATIENT_CLINIC_OR_DEPARTMENT_OTHER): Payer: Self-pay | Admitting: Nurse Practitioner

## 2021-03-15 ENCOUNTER — Ambulatory Visit (HOSPITAL_BASED_OUTPATIENT_CLINIC_OR_DEPARTMENT_OTHER): Payer: Managed Care, Other (non HMO) | Admitting: Nurse Practitioner

## 2021-03-15 ENCOUNTER — Other Ambulatory Visit: Payer: Self-pay

## 2021-03-15 VITALS — BP 128/72 | HR 77 | Ht 62.0 in | Wt 150.0 lb

## 2021-03-15 DIAGNOSIS — H66002 Acute suppurative otitis media without spontaneous rupture of ear drum, left ear: Secondary | ICD-10-CM | POA: Insufficient documentation

## 2021-03-15 DIAGNOSIS — K219 Gastro-esophageal reflux disease without esophagitis: Secondary | ICD-10-CM

## 2021-03-15 DIAGNOSIS — J0140 Acute pansinusitis, unspecified: Secondary | ICD-10-CM | POA: Diagnosis not present

## 2021-03-15 DIAGNOSIS — Z0000 Encounter for general adult medical examination without abnormal findings: Secondary | ICD-10-CM | POA: Insufficient documentation

## 2021-03-15 MED ORDER — AMOXICILLIN-POT CLAVULANATE 875-125 MG PO TABS: 1.0000 | ORAL_TABLET | Freq: Two times a day (BID) | ORAL | 0 refills | Status: DC

## 2021-03-15 MED ORDER — FLUCONAZOLE 150 MG PO TABS: ORAL_TABLET | ORAL | 2 refills | Status: DC

## 2021-03-15 NOTE — Assessment & Plan Note (Signed)
Symptoms and presentation consistent with ongoing sinusitis not completely resolved with use of azithromycin. Discussed with patient postviral bacterial infections that are commonly seen.  At this time recommend 5 days of Augmentin twice a day to see if we can get her symptoms cleared up.  She has no alarm symptoms present today.  Lungs are clear.  We will plan to follow-up if patient symptoms worsen or fail to improve.

## 2021-03-15 NOTE — Patient Instructions (Signed)
Thank you for choosing [facility] for your Primary Care needs. I am excited for the opportunity to partner with you to meet your health care goals. It was a pleasure meeting you today!  Recommendations from today's visit: Let me know if the antibiotic is not helpful for your symptoms or if the symptoms go away and then come back.  I have attached information on reflux and food choices that could be helpful. If this seems to be getting worse or the famotidine is not helping, please let me know.   Information on diet, exercise, and health maintenance recommendations are listed below. This is information to help you be sure you are on track for optimal health and monitoring.   Please look over this and let us know if you have any questions or if you have completed any of the health maintenance outside of [facility] so that we can be sure your records are up to date.  ___________________________________________________________ About Me: I am an Adult-Geriatric Nurse Practitioner with a background in caring for patients for more than 20 years with a strong intensive care background. I provide primary care and sports medicine services to patients age 72 and older within this office. My education had a strong focus on caring for the older adult population, which I am passionate about. I am also the director of the APP Fellowship with [employer].   My desire is to provide you with the best service through preventive medicine and supportive care. I consider you a part of the medical team and value your input. I work diligently to ensure that you are heard and your needs are met in a safe and effective manner. I want you to feel comfortable with me as your provider and want you to know that your health concerns are important to me.  For your information, our office hours are: Monday, Tuesday, and Thursday 8:00 AM - 5:00 PM Wednesday and Friday 8:00 AM - 12:00 PM.   In  my time away from the office I am teaching new APP's within the system and am unavailable, but my partner, Dr. Burnard Bunting is in the office for emergent needs.   If you have questions or concerns, please call our office at [phone] or send [place] a MyChart message and we will respond as quickly as possible.  ____________________________________________________________ MyChart:  For all urgent or time sensitive needs we ask that you please call the office to avoid delays. Our number is [phone]. MyChart is not constantly monitored and due to the large volume of messages a day, replies may take up to 72 business hours.  MyChart Policy: MyChart allows for you to see your visit notes, after visit summary, provider recommendations, lab and tests results, make an appointment, request refills, and contact your provider or the office for non-urgent questions or concerns. Providers are seeing patients during normal business hours and do not have built in time to review MyChart messages.  We ask that you allow a minimum of 3 business days for responses to [employer]. For this reason, please do not send urgent requests through [place]. Please call the office at [phone]. New and ongoing conditions may require a visit. We have virtual and in person visit available for your convenience.  Complex MyChart concerns may require a visit. Your provider may request you schedule a virtual or in person visit to ensure we are providing the best care possible. MyChart messages sent after 11:00 AM on Friday  will not be received by the provider until Monday morning.    Lab and Test Results: You will receive your lab and test results on MyChart as soon as they are completed and results have been sent by the lab or testing facility. Due to this service, you will receive your results BEFORE your provider.  I review lab and tests results each morning prior to seeing patients. Some results require collaboration with  other providers to ensure you are receiving the most appropriate care. For this reason, we ask that you please allow a minimum of 3-5 business days from the time the ALL results have been received for your provider to receive and review lab and test results and contact you about these.  Most lab and test result comments from the provider will be sent through [place]. Your provider may recommend changes to the plan of care, follow-up visits, repeat testing, ask questions, or request an office visit to discuss these results. You may reply directly to this message or call the office at [phone] to provide information for the provider or set up an appointment. In some instances, you will be called with test results and recommendations. Please let us know if this is preferred and we will make note of this in your chart to provide this for you.    If you have not heard a response to your lab or test results in 5 business days from all results returning to [place], please call the office to let us know. We ask that you please avoid calling prior to this time unless there is an emergent concern. Due to high call volumes, this can delay the resulting process.  After Hours: For all non-emergency after hours needs, please call the office at [phone] and select the option to reach the on-call provider service. On-call services are shared between multiple [place] offices and therefore it will not be possible to speak directly with your provider. On-call providers may provide medical advice and recommendations, but are unable to provide refills for maintenance medications.  For all emergency or urgent medical needs after normal business hours, we recommend that you seek care at the closest Urgent Care or Emergency Department to ensure appropriate treatment in a timely manner.  [facility] has a 24 hour emergency room located on the ground floor for your convenience.   Urgent Concerns  During the Business Day Providers are seeing patients from 8AM to [place] with a busy schedule and are most often not able to respond to non-urgent calls until the end of the day or the next business day. If you should have URGENT concerns during the day, please call and speak to the nurse or schedule a same day appointment so that we can address your concern without delay.   Thank you, again, for choosing me as your health care partner. I appreciate your trust and look forward to learning more about you.   Worthy Keeler, DNP, AGNP-c ___________________________________________________________  Health Maintenance Recommendations Screening Testing Mammogram Every 1 -2 years based on history and risk factors Starting at age 108 Pap Smear Ages 21-39 every 3 years Ages 47-65 every 5 years with HPV testing More frequent testing may be required based on results and history Colon Cancer Screening Every 1-10 years based on test performed, risk factors, and history Starting at age 21 Bone Density Screening Every 2-10 years based on history Starting at age 60 for women Recommendations for men differ based on medication usage, history, and  risk factors AAA Screening One time ultrasound Men 36-48 years old who have every smoked Lung Cancer Screening Low Dose Lung CT every 12 months Age 7-80 years with a 30 pack-year smoking history who still smoke or who have quit within the last 15 years  Screening Labs Routine  Labs: Complete Blood Count (CBC), Complete Metabolic Panel (CMP), Cholesterol (Lipid Panel) Every 6-12 months based on history and medications May be recommended more frequently based on current conditions or previous results Hemoglobin A1c Lab Every 3-12 months based on history and previous results Starting at age 71 or earlier with diagnosis of diabetes, high cholesterol, BMI >26, and/or risk factors Frequent monitoring for patients with diabetes to ensure blood sugar  control Thyroid Panel (TSH w/ T3 & T4) Every 6 months based on history, symptoms, and risk factors May be repeated more often if on medication HIV One time testing for all patients 35 and older May be repeated more frequently for patients with increased risk factors or exposure Hepatitis C One time testing for all patients 34 and older May be repeated more frequently for patients with increased risk factors or exposure Gonorrhea, Chlamydia Every 12 months for all sexually active persons 13-24 years Additional monitoring may be recommended for those who are considered high risk or who have symptoms PSA Men 80-5 years old with risk factors Additional screening may be recommended from age 59-69 based on risk factors, symptoms, and history  Vaccine Recommendations Tetanus Booster All adults every 10 years Flu Vaccine All patients 6 months and older every year COVID Vaccine All patients 12 years and older Initial dosing with booster May recommend additional booster based on age and health history HPV Vaccine 2 doses all patients age 77-26 Dosing may be considered for patients over 26 Shingles Vaccine (Shingrix) 2 doses all adults 74 years and older Pneumonia (Pneumovax 23) All adults 51 years and older May recommend earlier dosing based on health history Pneumonia (Prevnar 103) All adults 82 years and older Dosed 1 year after Pneumovax 23  Additional Screening, Testing, and Vaccinations may be recommended on an individualized basis based on family history, health history, risk factors, and/or exposure.  __________________________________________________________  Diet Recommendations for All Patients  I recommend that all patients maintain a diet low in saturated fats, carbohydrates, and cholesterol. While this can be challenging at first, it is not impossible and small changes can make big differences.  Things to try: Decreasing the amount of soda, sweet tea, and/or juice to  one or less per day and replace with water While water is always the first choice, if you do not like water you may consider adding a water additive without sugar to improve the taste other sugar free drinks Replace potatoes with a brightly colored vegetable at dinner Use healthy oils, such as canola oil or olive oil, instead of butter or hard margarine Limit your bread intake to two pieces or less a day Replace regular pasta with low carb pasta options Bake, broil, or grill foods instead of frying Monitor portion sizes  Eat smaller, more frequent meals throughout the day instead of large meals  An important thing to remember is, if you love foods that are not great for your health, you don't have to give them up completely. Instead, allow these foods to be a reward when you have done well. Allowing yourself to still have special treats every once in a while is a nice way to tell yourself thank you for working hard to keep  yourself healthy.   Also remember that every day is a new day. If you have a bad day and "fall off the wagon", you can still climb right back up and keep moving along on your journey!  We have resources available to help you!  Some websites that may be helpful include: www.http://carter.biz/  Www.VeryWellFit.com _____________________________________________________________  Activity Recommendations for All Patients  I recommend that all adults get at least 20 minutes of moderate physical activity that elevates your heart rate at least 5 days out of the week.  Some examples include: Walking or jogging at a pace that allows you to carry on a conversation Cycling (stationary bike or outdoors) Water aerobics Yoga Weight lifting Dancing If physical limitations prevent you from putting stress on your joints, exercise in a pool or seated in a chair are excellent options.  Do determine your MAXIMUM heart rate for activity: YOUR AGE - 220 = MAX HeartRate   Remember! Do not  push yourself too hard.  Start slowly and build up your pace, speed, weight, time in exercise, etc.  Allow your body to rest between exercise and get good sleep. You will need more water than normal when you are exerting yourself. Do not wait until you are thirsty to drink. Drink with a purpose of getting in at least 8, 8 ounce glasses of water a day plus more depending on how much you exercise and sweat.    If you begin to develop dizziness, chest pain, abdominal pain, jaw pain, shortness of breath, headache, vision changes, lightheadedness, or other concerning symptoms, stop the activity and allow your body to rest. If your symptoms are severe, seek emergency evaluation immediately. If your symptoms are concerning, but not severe, please let us know so that we can recommend further evaluation.

## 2021-03-15 NOTE — Assessment & Plan Note (Signed)
Symptoms and presentation consistent with GERD. Discussed etiology with patient and foods/drinks that may worsen symptoms and cause exacerbation. Discussed with patient that famotidine use can be either daily or as needed based on symptoms.  It is okay to take this medication long-term and monitor for worsening symptoms.  Discussed with patient if the famotidine appears to no longer be working or if her symptoms significantly worsen to contact the office for further evaluation.

## 2021-03-15 NOTE — Progress Notes (Signed)
Tollie Eth, DNP, AGNP-c Primary Care & Sports Medicine [address]   [address] [place], [place] [address] [phone]  New patient visit   Patient: Miranda Valentine [facility]   DOB: 1955/08/04   66 y.o. Female  MRN: [id number] Visit Date: 03/15/2021  Patient Care Team: Ameen Mostafa, Sung Amabile, NP as PCP - General (Nurse Practitioner)  Today's healthcare provider: Tollie Eth, NP   Chief Complaint  Patient presents with   Establish Care   New Patient (Initial Visit)    Patient presents today to establish care, she is a kind referral from Dr. Hyacinth Meeker. She was seen by [facility] 03/05/21 for sinus issues ( telephone visit) she has completed Zpac and not feeling any better. Pressure in her eyes, red throat. She would like to discuss acid reflux with you.    Subjective    Miranda Valentine is a 66 y.o. female who presents today as a new patient to establish care.    Patient endorses the following concerns presently: Sinus Patient endorses having COVID approximately 3 weeks ago which left residual sinus pain and pressure. She was seen via virtual visit approximately 10 days ago and started on Z-pack.  She completed this medication earlier this week however is still having symptoms significant for pain behind the eyes, sinus headache, and sore throat.  She reports that her symptoms are somewhat improved but have not resolved.  She denies fever, chills, body aches.  She does not have her tonsils in place.  She is not having any shortness of breath, chest pain, palpitations.  Reflux Patient endorses longstanding history of intermittent acid reflux.  She tells me that in the past she was able to manage this with Tums however with time she found that she was increasing the frequency and not receiving as much benefit from the medication.  Recently she switched to famotidine once a day and tells me that this medication is working but she would like to know if it this is safe to  take.  History reviewed and reveals the following: Past Medical History:  Diagnosis Date   Anginal pain (HCC) 11/07/2011   Asthma    Cholecystitis    Depression    Dry eye    Migraines    Osteopenia    Ovarian cyst    benign   Positive TB test    skin test   Past Surgical History:  Procedure Laterality Date   ABDOMINAL HYSTERECTOMY  1982   TAH/RSO   CHOLECYSTECTOMY N/A 11/12/2018   Procedure: LAPAROSCOPIC CHOLECYSTECTOMY WITH INTRAOPERATIVE CHOLANGIOGRAM;  Surgeon: Griselda Miner, MD;  Location: [facility];  Service: General;  Laterality: N/A;   LSO  1981   TONSILLECTOMY AND ADENOIDECTOMY  1997   Family Status  Relation Name Status   Mother  Deceased   Father  Deceased   Sister  Alive   Brother  Alive   MGM  (Not Specified)   MGF  (Not Specified)   PGF  (Not Specified)   Son  Alive   Family History  Problem Relation Age of Onset   Cancer Mother        unclear primary-mets   Rheum arthritis Mother    Hypertension Father    Other Sister        rheumatoid factor positive   CVA Maternal Grandmother    CVA Maternal Grandfather    Parkinson's disease Maternal Grandfather    CVA Paternal Grandfather    Healthy Son  Social History   Socioeconomic History   Marital status: Married    Spouse name: Not on file   Number of children: Not on file   Years of education: Not on file   Highest education level: Not on file  Occupational History   Not on file  Tobacco Use   Smoking status: Never   Smokeless tobacco: Never  Vaping Use   Vaping Use: Never used  Substance and Sexual Activity   Alcohol use: Yes    Alcohol/week: 5.0 standard drinks    Types: 5 Glasses of wine per week    Comment: 5 glasses weekly   Drug use: No   Sexual activity: Yes    Partners: Male    Birth control/protection: Surgical    Comment: TAH/BSO  Other Topics Concern   Not on file  Social History Narrative   Not on file   Social Determinants of Health   Financial Resource Strain:  Not on file  Food Insecurity: Not on file  Transportation Needs: Not on file  Physical Activity: Not on file  Stress: Not on file  Social Connections: Not on file   Outpatient Medications Prior to Visit  Medication Sig   Biotin 1 MG CAPS Take by mouth at bedtime.    cetirizine (ZYRTEC) 10 MG tablet Take 10 mg by mouth at bedtime.    estradiol (ESTRACE) 0.5 MG tablet Take 1 tablet (0.5 mg total) by mouth at bedtime.   fluticasone (FLONASE ALLERGY RELIEF) 50 MCG/ACT nasal spray 2 sprays   montelukast (SINGULAIR) 10 MG tablet SMARTSIG:1 Tablet(s) By Mouth Every Evening   Multiple Vitamins-Minerals (MULTIVITAMINS THER. W/MINERALS) TABS Take 1 tablet by mouth at bedtime.    PRISTIQ 100 MG 24 hr tablet Take 100 mg by mouth every morning.   Vitamin D, Ergocalciferol, (DRISDOL) 1.25 MG (50000 UNIT) CAPS capsule Take 1 capsule (50,000 Units total) by mouth every 14 (fourteen) days.   XIIDRA 5 % SOLN ADMINISTER 1 DROP BOTH EYES TWICE DAILY   zaleplon (SONATA) 10 MG capsule Take 1 capsule by mouth as needed.   [DISCONTINUED] azelastine (ASTELIN) 0.1 % nasal spray USE 1 TO 2 SPRAYS IN EACH NOSTRIL TWICE DAILY (Patient taking differently: Place 1-2 sprays into both nostrils 2 (two) times daily as needed for rhinitis or allergies.)   [DISCONTINUED] Biotin 1 MG CAPS 1 capsule  at bedtime   [DISCONTINUED] fluticasone (FLONASE) 50 MCG/ACT nasal spray Place 2 sprays into both nostrils at bedtime.    No facility-administered medications prior to visit.   No Known Allergies Immunization History  Administered Date(s) Administered   Influenza Split 12/23/2013   Influenza, High Dose Seasonal PF 10/23/2016, 10/23/2017, 03/01/2020   Influenza,inj,Quad PF,6+ Mos 12/06/2016   Influenza-Unspecified 11/17/2018, 11/10/2019   PFIZER(Purple Top)SARS-COV-2 Vaccination 03/11/2019, 04/03/2019, 07/27/2019   Tdap 07/18/2016   Zoster Recombinat (Shingrix) 10/16/2017, 01/22/2018    Health Maintenance Due: Health  Maintenance  Topic Date Due   COVID-19 Vaccine (4 - Booster for Pfizer series) 09/21/2019   Pneumonia Vaccine 5465+ Years old (1 - PCV) Never done   MAMMOGRAM  03/22/2022   COLONOSCOPY (Pts 45-2172yrs Insurance coverage will need to be confirmed)  09/27/2025   TETANUS/TDAP  07/19/2026   INFLUENZA VACCINE  Completed   DEXA SCAN  Completed   Hepatitis C Screening  Completed   Zoster Vaccines- Shingrix  Completed   HPV VACCINES  Aged Out    Review of Systems All review of systems negative except what is listed in the HPI  Objective    BP 128/72    Pulse 77    Ht 5\' 2"  (1.575 m)    Wt 150 lb (68 kg)    LMP 01/28/1984    SpO2 97%    BMI 27.44 kg/m  Physical Exam Vitals and nursing note reviewed.  Constitutional:      General: She is not in acute distress.    Appearance: Normal appearance.  HENT:     Head: Normocephalic.     Right Ear: A middle ear effusion is present.     Left Ear: A middle ear effusion is present. Tympanic membrane is bulging.     Nose: Congestion present.     Mouth/Throat:     Lips: Pink.     Mouth: Mucous membranes are moist.     Pharynx: Oropharyngeal exudate and posterior oropharyngeal erythema present.  Eyes:     Extraocular Movements: Extraocular movements intact.     Conjunctiva/sclera: Conjunctivae normal.     Pupils: Pupils are equal, round, and reactive to light.  Neck:     Vascular: No carotid bruit.  Cardiovascular:     Rate and Rhythm: Normal rate and regular rhythm.     Pulses: Normal pulses.     Heart sounds: Normal heart sounds. No murmur heard. Pulmonary:     Effort: Pulmonary effort is normal.     Breath sounds: Normal breath sounds. No wheezing.  Abdominal:     General: Bowel sounds are normal.     Palpations: Abdomen is soft.  Musculoskeletal:        General: Normal range of motion.     Cervical back: Normal range of motion.     Right lower leg: No edema.     Left lower leg: No edema.  Skin:    General: Skin is warm and dry.      Capillary Refill: Capillary refill takes less than 2 seconds.  Neurological:     General: No focal deficit present.     Mental Status: She is alert and oriented to person, place, and time.  Psychiatric:        Mood and Affect: Mood normal.        Behavior: Behavior normal.        Thought Content: Thought content normal.        Judgment: Judgment normal.    No results found for any visits on 03/15/21.  Assessment & Plan      Problem List Items Addressed This Visit     Encounter for medical examination to establish care   Subacute pansinusitis - Primary    Symptoms and presentation consistent with ongoing sinusitis not completely resolved with use of azithromycin. Discussed with patient postviral bacterial infections that are commonly seen.  At this time recommend 5 days of Augmentin twice a day to see if we can get her symptoms cleared up.  She has no alarm symptoms present today.  Lungs are clear.  We will plan to follow-up if patient symptoms worsen or fail to improve.      Relevant Medications   fluticasone (FLONASE ALLERGY RELIEF) 50 MCG/ACT nasal spray   amoxicillin-clavulanate (AUGMENTIN) 875-125 MG tablet   fluconazole (DIFLUCAN) 150 MG tablet   Non-recurrent acute suppurative otitis media of left ear without spontaneous rupture of tympanic membrane    Effusion bilaterally with left ear showing purulent effusion present. Most likely bacterial in etiology related to recent viral infection.  No significant improvement with azithromycin. We will start trial of Augmentin for  5 days.  Patient will continue using Flonase.  Discussed that fullness in ears can take several weeks to resolve but to continue the Flonase.  She will follow-up if symptoms worsen or fail to improve.      Relevant Medications   amoxicillin-clavulanate (AUGMENTIN) 875-125 MG tablet   fluconazole (DIFLUCAN) 150 MG tablet   Gastroesophageal reflux disease    Symptoms and presentation consistent with  GERD. Discussed etiology with patient and foods/drinks that may worsen symptoms and cause exacerbation. Discussed with patient that famotidine use can be either daily or as needed based on symptoms.  It is okay to take this medication long-term and monitor for worsening symptoms.  Discussed with patient if the famotidine appears to no longer be working or if her symptoms significantly worsen to contact the office for further evaluation.         Return in about 1 year (around 03/15/2022) for Wellness visit.     Navneet Schmuck, Sung Amabile, NP, DNP, AGNP-C Primary Care & Sports Medicine at [facility]

## 2021-03-15 NOTE — Assessment & Plan Note (Signed)
Effusion bilaterally with left ear showing purulent effusion present. Most likely bacterial in etiology related to recent viral infection.  No significant improvement with azithromycin. We will start trial of Augmentin for 5 days.  Patient will continue using Flonase.  Discussed that fullness in ears can take several weeks to resolve but to continue the Flonase.  She will follow-up if symptoms worsen or fail to improve.

## 2021-03-21 ENCOUNTER — Telehealth (HOSPITAL_BASED_OUTPATIENT_CLINIC_OR_DEPARTMENT_OTHER): Payer: Self-pay

## 2021-03-22 ENCOUNTER — Other Ambulatory Visit (HOSPITAL_BASED_OUTPATIENT_CLINIC_OR_DEPARTMENT_OTHER): Payer: Self-pay | Admitting: Nurse Practitioner

## 2021-03-22 DIAGNOSIS — J0140 Acute pansinusitis, unspecified: Secondary | ICD-10-CM

## 2021-03-22 MED ORDER — PREDNISONE 50 MG PO TABS: 50.0000 mg | ORAL_TABLET | Freq: Every day | ORAL | 0 refills | Status: DC

## 2021-03-26 ENCOUNTER — Other Ambulatory Visit: Payer: Self-pay

## 2021-03-26 ENCOUNTER — Ambulatory Visit
Admission: RE | Admit: 2021-03-26 | Discharge: 2021-03-26 | Disposition: A | Discharge status: Home / Self Care | Payer: Managed Care, Other (non HMO) | Source: Ambulatory Visit

## 2021-03-26 DIAGNOSIS — Z1231 Encounter for screening mammogram for malignant neoplasm of breast: Secondary | ICD-10-CM

## 2021-03-28 ENCOUNTER — Encounter: Payer: Self-pay | Admitting: Podiatry

## 2021-03-28 ENCOUNTER — Other Ambulatory Visit: Payer: Self-pay

## 2021-03-28 ENCOUNTER — Ambulatory Visit: Payer: Managed Care, Other (non HMO) | Admitting: Podiatry

## 2021-03-28 DIAGNOSIS — M722 Plantar fascial fibromatosis: Secondary | ICD-10-CM

## 2021-03-28 MED ORDER — TRIAMCINOLONE ACETONIDE 40 MG/ML IJ SUSP
20.0000 mg | Freq: Once | INTRAMUSCULAR | Status: AC
  Administered 2021-03-28: 20 mg

## 2021-03-28 NOTE — Progress Notes (Signed)
She presents today states that the right foot seems to be doing good the left foot has started hurting and she has been doing well for a long time. ? ?Objective: Vital signs stable alert oriented x3 still has pain to palpation MucoClear tubercle of the left foot. ? ?Assessment: Plan fasciitis resolved right symptomatic left. ? ?Plan: Injected left heel today 20 mg Kenalog 5 mg Marcaine point maximal tenderness encouraged her to get new shoes. ? ? ?

## 2021-04-01 ENCOUNTER — Ambulatory Visit
Admission: RE | Admit: 2021-04-01 | Discharge: 2021-04-01 | Disposition: A | Discharge status: Home / Self Care | Payer: Managed Care, Other (non HMO) | Source: Ambulatory Visit | Attending: Nurse Practitioner | Admitting: Nurse Practitioner

## 2021-04-01 ENCOUNTER — Other Ambulatory Visit: Payer: Self-pay

## 2021-04-01 ENCOUNTER — Encounter (HOSPITAL_BASED_OUTPATIENT_CLINIC_OR_DEPARTMENT_OTHER): Payer: Self-pay | Admitting: Nurse Practitioner

## 2021-04-01 ENCOUNTER — Ambulatory Visit (INDEPENDENT_AMBULATORY_CARE_PROVIDER_SITE_OTHER): Payer: Managed Care, Other (non HMO) | Admitting: Nurse Practitioner

## 2021-04-01 DIAGNOSIS — J0140 Acute pansinusitis, unspecified: Secondary | ICD-10-CM | POA: Diagnosis not present

## 2021-04-01 NOTE — Progress Notes (Signed)
Virtual Visit Encounter telephone visit. ? ? ?I connected with  Miranda Valentine on 04/01/21 at  1:10 PM EST by secure audio and/or video enabled telemedicine application. I verified that I am speaking with the correct person using two identifiers. ?  ?I introduced myself as a Publishing rights manager with the practice. The limitations of evaluation and management by telemedicine discussed with the patient and the availability of in person appointments. The patient expressed verbal understanding and consent to proceed. ? ?Participating parties in this visit include: Myself and patient ? ?The patient is: Patient Location: Home ?I am: Provider Location: Office/Clinic ?Subjective:   ? ?CC and HPI: Miranda Valentine is a 66 y.o. year old female presenting for follow up of sinusitis. ?Continued sinus pressure behind the eyes, pain in the left ear with fullness, throat erythema and raw.  She has been on two rounds of antibiotics at this time and has completed a steroid burst. She reports that the pain is not improved. She did not feel much different while on antibiotic therapy and had mild improvement with the steroid, but she reports that her symptoms are basically the same. She has had covid recently and is not sure if this is a covid reaction or something else. Her sister works for an [employer] and suggested she ask about a sinus x-ray. She does have allergy history but is taking multiple medications daily for this. She denies fever or chills. She endorses fatigue.  ? ? ?Past medical history, Surgical history, Family history not pertinant except as noted below, Social history, Allergies, and medications have been entered into the medical record, reviewed, and corrections made.  ? ?Review of Systems:  ?All review of systems negative except what is listed in the HPI ? ?Objective:   ? ?Alert and oriented x 4 ?Speaking in clear sentences with no shortness of breath. ?No distress. ? ?Impression and Recommendations:   ? ?Problem List  Items Addressed This Visit   ? ? Subacute pansinusitis - Primary  ?  Continued symptoms of sinus/eye pressure, ear pressure/fullness, sore throat despite treatment with abx x2 and steroid burst.  ?I am concerned of possible fungal etiology present given her symptoms. She is not having any alarm symptoms present. Possible continued inflammation from COVID could be contributing, but at this time it is unclear.  ?I do think that sinus x-ray is a reasonable next step for further evaluation. Will plan to do that and monitor for opacities that could indicate ongoing infectious process vs inflammation. If infectious process present, treatment with antifungal would certainly be warranted. We can also consider ENT referral for further evaluation if no etiology is discovered for sx.  ?Will f/u based on imaging results.  ?  ?  ? Relevant Orders  ? DG Sinuses Complete  ? ? ?orders and follow up as documented in EMR ?I discussed the assessment and treatment plan with the patient. The patient was provided an opportunity to ask questions and all were answered. The patient agreed with the plan and demonstrated an understanding of the instructions. ?  ?The patient was advised to call back or seek an in-person evaluation if the symptoms worsen or if the condition fails to improve as anticipated. ? ?Follow-Up: TBD based on imaging ? ?I provided 22 minutes of non-face-to-face interaction with this non face-to-face encounter including intake, same-day documentation, and chart review.  ? ?Tollie Eth, NP , DNP, AGNP-c ?[facility] ?Primary Care & Sports Medicine at [facility] ?[phone] ?[phone] (  fax) ? ?

## 2021-04-01 NOTE — Assessment & Plan Note (Signed)
Continued symptoms of sinus/eye pressure, ear pressure/fullness, sore throat despite treatment with abx x2 and steroid burst.  ?I am concerned of possible fungal etiology present given her symptoms. She is not having any alarm symptoms present. Possible continued inflammation from COVID could be contributing, but at this time it is unclear.  ?I do think that sinus x-ray is a reasonable next step for further evaluation. Will plan to do that and monitor for opacities that could indicate ongoing infectious process vs inflammation. If infectious process present, treatment with antifungal would certainly be warranted. We can also consider ENT referral for further evaluation if no etiology is discovered for sx.  ?Will f/u based on imaging results.  ?

## 2021-04-01 NOTE — Patient Instructions (Signed)
[facility] ?Located inside [facility] ?Address: [address], [place], [place] [address] ?Hours: 8am -5:30pm Monday through Friday ?You may walk-in for x-rays.  ? ? ?I will be in touch with you once the results come in and we can determine if further treatments are needed or if we need to look at an inflammatory process that may need ENT evaluation.  ? ?Let me know if your symptoms worsen or if you develop new symptoms in the meantime.  ? ?

## 2021-04-02 ENCOUNTER — Telehealth (HOSPITAL_BASED_OUTPATIENT_CLINIC_OR_DEPARTMENT_OTHER): Payer: Self-pay

## 2021-04-02 DIAGNOSIS — J0140 Acute pansinusitis, unspecified: Secondary | ICD-10-CM

## 2021-04-02 NOTE — Telephone Encounter (Signed)
Sent referral to ENT per SB, Patient is aware.  ?

## 2021-06-04 ENCOUNTER — Ambulatory Visit (HOSPITAL_BASED_OUTPATIENT_CLINIC_OR_DEPARTMENT_OTHER): Payer: Managed Care, Other (non HMO) | Admitting: Obstetrics & Gynecology

## 2021-06-07 ENCOUNTER — Ambulatory Visit (INDEPENDENT_AMBULATORY_CARE_PROVIDER_SITE_OTHER): Payer: Managed Care, Other (non HMO) | Admitting: Obstetrics & Gynecology

## 2021-06-07 ENCOUNTER — Encounter (HOSPITAL_BASED_OUTPATIENT_CLINIC_OR_DEPARTMENT_OTHER): Payer: Self-pay | Admitting: Obstetrics & Gynecology

## 2021-06-07 VITALS — BP 125/62 | HR 71 | Ht 62.0 in | Wt 150.4 lb

## 2021-06-07 DIAGNOSIS — Z01419 Encounter for gynecological examination (general) (routine) without abnormal findings: Secondary | ICD-10-CM | POA: Diagnosis not present

## 2021-06-07 DIAGNOSIS — Z1211 Encounter for screening for malignant neoplasm of colon: Secondary | ICD-10-CM

## 2021-06-07 DIAGNOSIS — Z79890 Hormone replacement therapy (postmenopausal): Secondary | ICD-10-CM | POA: Diagnosis not present

## 2021-06-07 DIAGNOSIS — E559 Vitamin D deficiency, unspecified: Secondary | ICD-10-CM

## 2021-06-07 DIAGNOSIS — M85852 Other specified disorders of bone density and structure, left thigh: Secondary | ICD-10-CM | POA: Diagnosis not present

## 2021-06-07 DIAGNOSIS — E785 Hyperlipidemia, unspecified: Secondary | ICD-10-CM

## 2021-06-07 DIAGNOSIS — Z0000 Encounter for general adult medical examination without abnormal findings: Secondary | ICD-10-CM

## 2021-06-07 MED ORDER — VITAMIN D (ERGOCALCIFEROL) 1.25 MG (50000 UNIT) PO CAPS: 50000.0000 [IU] | ORAL_CAPSULE | ORAL | 3 refills | Status: DC

## 2021-06-07 MED ORDER — ESTRADIOL 0.5 MG PO TABS: 0.5000 mg | ORAL_TABLET | Freq: Every day | ORAL | 3 refills | Status: DC

## 2021-06-07 NOTE — Progress Notes (Signed)
66 y.o. G1P1 Married White or Caucasian female here for annual exam.  Doing well.  Has been having sinus issues.  Seemed to start after she had Covid.  Has appointment upcoming with allergies.  Needs referral for colonoscopy ? ?Patient's last menstrual period was 01/28/1984.          ?Sexually active: Yes.    ?The current method of family planning is status post hysterectomy.    ?Smoker:  no ? ?Health Maintenance: ?Pap:  07/18/2016 Negative ?History of abnormal Pap:  no ?MMG:  03/26/2021 Negative ?Colonoscopy:  09/28/2015, she is aware this is due.   ?BMD:   06/04/2020 Osteopenia, stable from 2017.  T score -1.7 ?Screening Labs: ordered today ? ? reports that she has never smoked. She has never used smokeless tobacco. She reports current alcohol use of about 5.0 standard drinks per week. She reports that she does not use drugs. ? ?Past Medical History:  ?Diagnosis Date  ? Anginal pain (HCC) 11/07/2011  ? Asthma   ? Cholecystitis   ? Depression   ? Dry eye   ? Migraines   ? Osteopenia   ? Ovarian cyst   ? benign  ? Positive TB test   ? skin test  ? ? ?Past Surgical History:  ?Procedure Laterality Date  ? ABDOMINAL HYSTERECTOMY  1982  ? TAH/RSO  ? CHOLECYSTECTOMY N/A 11/12/2018  ? Procedure: LAPAROSCOPIC CHOLECYSTECTOMY WITH INTRAOPERATIVE CHOLANGIOGRAM;  Surgeon: Griselda Miner, MD;  Location: [facility];  Service: General;  Laterality: N/A;  ? LSO  1981  ? TONSILLECTOMY AND ADENOIDECTOMY  1997  ? ? ?Current Outpatient Medications  ?Medication Sig Dispense Refill  ? Biotin 1 MG CAPS Take by mouth at bedtime.     ? cetirizine (ZYRTEC) 10 MG tablet Take 10 mg by mouth at bedtime.     ? fluconazole (DIFLUCAN) 150 MG tablet Take one tablet by mouth at the first sign of symptoms of yeast. If no resolution, repeat dose in 72 hours. 2 tablet 2  ? fluticasone (FLONASE) 50 MCG/ACT nasal spray 2 sprays    ? montelukast (SINGULAIR) 10 MG tablet SMARTSIG:1 Tablet(s) By Mouth Every Evening    ? Multiple Vitamins-Minerals  (MULTIVITAMINS THER. W/MINERALS) TABS Take 1 tablet by mouth at bedtime.     ? PRISTIQ 100 MG 24 hr tablet Take 100 mg by mouth every morning.    ? XIIDRA 5 % SOLN ADMINISTER 1 DROP BOTH EYES TWICE DAILY    ? zaleplon (SONATA) 10 MG capsule Take 1 capsule by mouth as needed.  5  ? estradiol (ESTRACE) 0.5 MG tablet Take 1 tablet (0.5 mg total) by mouth at bedtime. 90 tablet 3  ? Vitamin D, Ergocalciferol, (DRISDOL) 1.25 MG (50000 UNIT) CAPS capsule Take 1 capsule (50,000 Units total) by mouth every 14 (fourteen) days. 6 capsule 3  ? ?No current facility-administered medications for this visit.  ? ? ?Family History  ?Problem Relation Age of Onset  ? Cancer Mother   ?     unclear primary-mets  ? Rheum arthritis Mother   ? Hypertension Father   ? Other Sister   ?     rheumatoid factor positive  ? CVA Maternal Grandmother   ? CVA Maternal Grandfather   ? Parkinson's disease Maternal Grandfather   ? CVA Paternal Grandfather   ? Healthy Son   ? ? ?Genitourinary:negative ? ?Exam:   ?BP 125/62 (BP Location: Left Arm, Patient Position: Sitting, Cuff Size: Large)   Pulse 71  Ht 5\' 2"  (1.575 m)   Wt 150 lb 6.4 oz (68.2 kg)   LMP 01/28/1984   BMI 27.51 kg/m?   Height: 5\' 2"  (157.5 cm) ? ?General appearance: alert, cooperative and appears stated age ?Head: Normocephalic, without obvious abnormality, atraumatic ?Neck: no adenopathy, supple, symmetrical, trachea midline and thyroid normal to inspection and palpation ?Lungs: clear to auscultation bilaterally ?Breasts: normal appearance, no masses or tenderness ?Heart: regular rate and rhythm ?Abdomen: soft, non-tender; bowel sounds normal; no masses,  no organomegaly ?Extremities: extremities normal, atraumatic, no cyanosis or edema ?Skin: Skin color, texture, turgor normal. No rashes or lesions ?Lymph nodes: Cervical, supraclavicular, and axillary nodes normal. ?No abnormal inguinal nodes palpated ?Neurologic: Grossly normal ? ? ?Pelvic: External genitalia:  no lesions ?              Urethra:  normal appearing urethra with no masses, tenderness or lesions ?             Bartholins and Skenes: normal    ?             Vagina: normal appearing vagina with normal color and no discharge, no lesions ?             Cervix: absent ?             Pap taken: No. ?Bimanual Exam:  Uterus:  uterus absent ?             Adnexa: no mass, fullness, tenderness ?              Rectovaginal: Confirms ?              Anus:  normal sphincter tone, no lesions ? ?Chaperone, 03/27/1984, CMA, was present for exam. ? ?Assessment/Plan: ?1. Well woman exam with routine gynecological exam ?- pap 06/2016.  Not indicated today due to hx of hysterectomy ?- MMG 02/2021 ?- colonoscopy 2017.  This is due and pt aware. ?- BMD 5/20202 ?- lab work ordered today ?- vaccines reviewed/updated ? ?2. Colon cancer screening ?- Ambulatory referral to Gastroenterology ? ?3. Osteopenia of neck of left femur ?- Vitamin D, Ergocalciferol, (DRISDOL) 1.25 MG (50000 UNIT) CAPS capsule; Take 1 capsule (50,000 Units total) by mouth every 14 (fourteen) days.  Dispense: 6 capsule; Refill: 3 ? ?4. Postmenopausal HRT (hormone replacement therapy) ?- estradiol (ESTRACE) 0.5 MG tablet; Take 1 tablet (0.5 mg total) by mouth at bedtime.  Dispense: 90 tablet; Refill: 3 ? ?5. Elevated lipids ?- Comprehensive metabolic panel ?- Lipid panel ? ?6. Vitamin D deficiency ?- VITAMIN D 25 Hydroxy (Vit-D Deficiency, Fractures) ? ?7. Blood tests for routine general physical examination ?- CBC  ? ?

## 2021-06-08 LAB — LIPID PANEL
Chol/HDL Ratio: 2.3 ratio (ref 0.0–4.4)
Cholesterol, Total: 224 mg/dL — ABNORMAL HIGH (ref 100–199)
HDL: 97 mg/dL (ref >39)
LDL Chol Calc (NIH): 105 mg/dL — ABNORMAL HIGH (ref 0–99)
Triglycerides: 129 mg/dL (ref 0–149)
VLDL Cholesterol Cal: 22 mg/dL (ref 5–40)

## 2021-06-08 LAB — CBC
Hematocrit: 40.6 % (ref 34.0–46.6)
Hemoglobin: 13.8 g/dL (ref 11.1–15.9)
MCH: 30.2 pg (ref 26.6–33.0)
MCHC: 34 g/dL (ref 31.5–35.7)
MCV: 89 fL (ref 79–97)
Platelets: 310 10*3/uL (ref 150–450)
RBC: 4.57 x10E6/uL (ref 3.77–5.28)
RDW: 12.5 % (ref 11.7–15.4)
WBC: 4.2 10*3/uL (ref 3.4–10.8)

## 2021-06-08 LAB — COMPREHENSIVE METABOLIC PANEL
ALT: 18 IU/L (ref 0–32)
AST: 19 IU/L (ref 0–40)
Albumin/Globulin Ratio: 1.8 (ref 1.2–2.2)
Albumin: 4.7 g/dL (ref 3.8–4.8)
Alkaline Phosphatase: 84 IU/L (ref 44–121)
BUN/Creatinine Ratio: 16 (ref 12–28)
BUN: 11 mg/dL (ref 8–27)
Bilirubin Total: 0.3 mg/dL (ref 0.0–1.2)
CO2: 21 mmol/L (ref 20–29)
Calcium: 9.6 mg/dL (ref 8.7–10.3)
Chloride: 101 mmol/L (ref 96–106)
Creatinine, Ser: 0.7 mg/dL (ref 0.57–1.00)
Globulin, Total: 2.6 g/dL (ref 1.5–4.5)
Glucose: 87 mg/dL (ref 70–99)
Potassium: 4.5 mmol/L (ref 3.5–5.2)
Sodium: 139 mmol/L (ref 134–144)
Total Protein: 7.3 g/dL (ref 6.0–8.5)
eGFR: 95 mL/min/{1.73_m2} (ref >59)

## 2021-06-08 LAB — VITAMIN D 25 HYDROXY (VIT D DEFICIENCY, FRACTURES): Vit D, 25-Hydroxy: 39.9 ng/mL (ref 30.0–100.0)

## 2021-08-20 ENCOUNTER — Other Ambulatory Visit (HOSPITAL_BASED_OUTPATIENT_CLINIC_OR_DEPARTMENT_OTHER): Payer: Self-pay | Admitting: Family Medicine

## 2021-08-20 ENCOUNTER — Encounter (HOSPITAL_BASED_OUTPATIENT_CLINIC_OR_DEPARTMENT_OTHER): Payer: Self-pay | Admitting: Family Medicine

## 2021-08-20 ENCOUNTER — Ambulatory Visit (HOSPITAL_BASED_OUTPATIENT_CLINIC_OR_DEPARTMENT_OTHER): Payer: Managed Care, Other (non HMO) | Admitting: Family Medicine

## 2021-08-20 VITALS — BP 137/63 | HR 67 | Ht 62.0 in | Wt 154.4 lb

## 2021-08-20 DIAGNOSIS — K219 Gastro-esophageal reflux disease without esophagitis: Secondary | ICD-10-CM | POA: Diagnosis not present

## 2021-08-20 DIAGNOSIS — F32A Depression, unspecified: Secondary | ICD-10-CM | POA: Diagnosis not present

## 2021-08-20 DIAGNOSIS — R040 Epistaxis: Secondary | ICD-10-CM | POA: Diagnosis not present

## 2021-08-20 MED ORDER — PRISTIQ 100 MG PO TB24: 100.0000 mg | ORAL_TABLET | Freq: Every morning | ORAL | 0 refills | Status: DC

## 2021-08-20 MED ORDER — OMEPRAZOLE 10 MG PO CPDR: 10.0000 mg | DELAYED_RELEASE_CAPSULE | Freq: Every day | ORAL | 1 refills | Status: DC

## 2021-08-20 NOTE — Patient Instructions (Signed)
  Medication Instructions:  Your physician recommends that you continue on your current medications as directed. Please refer to the Current Medication list given to you today. --If you need a refill on any your medications before your next appointment, please call your pharmacy first. If no refills are authorized on file call the office.-- Lab Work: Your physician has recommended that you have lab work today: No If you have labs (blood work) drawn today and your tests are completely normal, you will receive your results via MyChart message OR a phone call from our staff.  Please ensure you check your voicemail in the event that you authorized detailed messages to be left on a delegated number. If you have any lab test that is abnormal or we need to change your treatment, we will call you to review the results.  Referrals/Procedures/Imaging: No  Follow-Up: Your next appointment:   Your physician recommends that you schedule a follow-up appointment in: 1-2 months with Enid Skeens, NP.  You will receive a text message or e-mail with a link to a survey about your care and experience with [place] today! We would greatly appreciate your feedback!   Thanks for letting us be apart of your health journey!!  Primary Care and Sports Medicine   Dr. Ceasar Mons [place]   We encourage you to activate your patient portal called "MyChart".  Sign up information is provided on this After Visit Summary.  MyChart is used to connect with patients for Virtual Visits (Telemedicine).  Patients are able to view lab/test results, encounter notes, upcoming appointments, etc.  Non-urgent messages can be sent to your provider as well. To learn more about what you can do with MyChart, please visit --  [email or web address].

## 2021-08-20 NOTE — Assessment & Plan Note (Signed)
Started famotidine earlier in the year for reflux symptoms. Has also had recent referral to GI. Did see her allergist who recommended taking the medication twice daily. Still with reflux symptoms despite this. Will be seeing GI, but this is for screening colonoscopy. Given lack of notable improvement with use of H2 blocker, will transition to low-dose PPI.  Discussed appropriate administration, taking medication first thing in the morning prior to first meal. Discussed that if not noticing significant improvement within the first 1 to 2 months of therapy, could transition from 10 mg dose of omeprazole to 20 mg dose.  Additional consideration would be further evaluation with gastroenterology.

## 2021-08-20 NOTE — Progress Notes (Signed)
Procedures performed today:    None.  Independent interpretation of notes and tests performed by another provider:   None.  Brief History, Exam, Impression, and Recommendations:    BP 137/63   Pulse 67   Ht '5\' 2"'  (1.575 m)   Wt 154 lb 6.4 oz (70 kg)   LMP 01/28/1984   SpO2 100%   BMI 28.24 kg/m   Epistaxis Nosebleeds have been an issue for at least several months, feels that they have been more frequent - about once weekly now. Typically short-lived. Has seen SaraBeth regarding this, recommendation for evaluation with ENT, however patient seems to not recall this.  On review, it does appear that ENT referral was placed, however unsure if referral provider has retired On exam, patent nares, normal-appearing mucous membranes, no active bleeding, no signs of crusting, scabbing, discharge.  No structural abnormality observed. Given chronic issue for patient with some progressive worsening, will refer to ENT for further evaluation  Gastroesophageal reflux disease Started famotidine earlier in the year for reflux symptoms. Has also had recent referral to GI. Did see her allergist who recommended taking the medication twice daily. Still with reflux symptoms despite this. Will be seeing GI, but this is for screening colonoscopy. Given lack of notable improvement with use of H2 blocker, will transition to low-dose PPI.  Discussed appropriate administration, taking medication first thing in the morning prior to first meal. Discussed that if not noticing significant improvement within the first 1 to 2 months of therapy, could transition from 10 mg dose of omeprazole to 20 mg dose.  Additional consideration would be further evaluation with gastroenterology.  Depression Other concern today is related to management of her desvenlafaxine. This has primarily been managed by her psychiatry office, but she indicates that when she met with Laretta Bolster previously, that she was willing to take over this  medication if desired.  Given that she has been on this medication for 7 years and feels stable on it, she would prefer to have this medication prescribed by PCP. Provided refill of medication today and will allow for medication to be continued by PCP as long as she is amenable to this  Return in about 6 weeks (around 10/01/2021) for follow-up with PCP.   ___________________________________________ Miranda Pyles de [place], MD, ABFM, [facility] [place]  Outpatient Encounter Medications as of 08/20/2021  Medication Sig   Biotin 1 MG CAPS Take by mouth at bedtime.    cetirizine (ZYRTEC) 10 MG tablet Take 10 mg by mouth at bedtime.    estradiol (ESTRACE) 0.5 MG tablet Take 1 tablet (0.5 mg total) by mouth at bedtime.   fluconazole (DIFLUCAN) 150 MG tablet Take one tablet by mouth at the first sign of symptoms of yeast. If no resolution, repeat dose in 72 hours.   montelukast (SINGULAIR) 10 MG tablet SMARTSIG:1 Tablet(s) By Mouth Every Evening   Multiple Vitamins-Minerals (MULTIVITAMINS THER. W/MINERALS) TABS Take 1 tablet by mouth at bedtime.    omeprazole (PRILOSEC) 10 MG capsule Take 1 capsule (10 mg total) by mouth daily.   Vitamin D, Ergocalciferol, (DRISDOL) 1.25 MG (50000 UNIT) CAPS capsule Take 1 capsule (50,000 Units total) by mouth every 14 (fourteen) days.   XIIDRA 5 % SOLN ADMINISTER 1 DROP BOTH EYES TWICE DAILY   zaleplon (SONATA) 10 MG capsule Take 1 capsule by mouth as needed.   [DISCONTINUED] PRISTIQ 100 MG 24 hr tablet Take 100 mg by mouth every morning.  fluticasone (FLONASE) 50 MCG/ACT nasal spray 2 sprays (Patient not taking: Reported on 08/20/2021)   PRISTIQ 100 MG 24 hr tablet Take 1 tablet (100 mg total) by mouth every morning.   No facility-administered encounter medications on file as of 08/20/2021.

## 2021-08-20 NOTE — Assessment & Plan Note (Addendum)
Other concern today is related to management of her desvenlafaxine. This has primarily been managed by her psychiatry office, but she indicates that when she met with Laretta Bolster previously, that she was willing to take over this medication if desired.  Given that she has been on this medication for 7 years and feels stable on it, she would prefer to have this medication prescribed by PCP. Provided refill of medication today and will allow for medication to be continued by PCP as long as she is amenable to this

## 2021-08-20 NOTE — Assessment & Plan Note (Signed)
Nosebleeds have been an issue for at least several months, feels that they have been more frequent - about once weekly now. Typically short-lived. Has seen SaraBeth regarding this, recommendation for evaluation with ENT, however patient seems to not recall this.  On review, it does appear that ENT referral was placed, however unsure if referral provider has retired On exam, patent nares, normal-appearing mucous membranes, no active bleeding, no signs of crusting, scabbing, discharge.  No structural abnormality observed. Given chronic issue for patient with some progressive worsening, will refer to ENT for further evaluation

## 2021-10-01 ENCOUNTER — Other Ambulatory Visit: Payer: Self-pay

## 2021-10-01 ENCOUNTER — Encounter (HOSPITAL_BASED_OUTPATIENT_CLINIC_OR_DEPARTMENT_OTHER): Payer: Self-pay | Admitting: Nurse Practitioner

## 2021-10-01 ENCOUNTER — Ambulatory Visit (INDEPENDENT_AMBULATORY_CARE_PROVIDER_SITE_OTHER): Payer: Managed Care, Other (non HMO) | Admitting: Nurse Practitioner

## 2021-10-01 VITALS — BP 124/66 | HR 86 | Temp 97.8°F | Resp 17 | Ht 62.0 in

## 2021-10-01 DIAGNOSIS — F32A Depression, unspecified: Secondary | ICD-10-CM | POA: Diagnosis not present

## 2021-10-01 DIAGNOSIS — R040 Epistaxis: Secondary | ICD-10-CM | POA: Diagnosis not present

## 2021-10-01 DIAGNOSIS — J324 Chronic pansinusitis: Secondary | ICD-10-CM

## 2021-10-01 DIAGNOSIS — K219 Gastro-esophageal reflux disease without esophagitis: Secondary | ICD-10-CM | POA: Diagnosis not present

## 2021-10-01 MED ORDER — PRISTIQ 100 MG PO TB24: 100.0000 mg | ORAL_TABLET | Freq: Every morning | ORAL | 3 refills | Status: DC

## 2021-10-01 NOTE — Progress Notes (Signed)
Shawna Clamp, DNP, AGNP-c [facility] [address] [place], [place] [address] [phone] Office [phone] Fax  ESTABLISHED PATIENT- Chronic Health and/or Follow-Up Visit  Vitals:   10/01/21 1020  BP: 124/66  Pulse: 86  Temp: 97.8 F (36.6 C)  Resp: 17  Height: 5\' 2"  (1.575 m)  SpO2: 100%  TempSrc: Temporal     @NAMEBYAGE @ is a 66 y.o. year old female presenting today for evaluation and management of the following: Follow-up (Patient states she is here for a 6 week follow up, medication and discuss referrals)   BRIEF HISTORY, IMPRESSION, RECOMMENDATIONS, and ROS  1. Epistaxis - Chronic for the past 5-6 months - history of chronic sinusitis - right nostril affected only - always occurring first thing in the morning upon awakening - has been nearly every 2 weeks consistently - Referral made for ENT at her last two visits, but she has not heard from them - no history of similar - does not feel the air in the home is particularly dry - was using flonase daily and saline spray daily- has stopped both of these, but no change in epistaxis  2. Chronic pansinusitis - No symptoms at this time - has not heard from ENT referral earlier this year  3. Gastroesophageal reflux disease, unspecified whether esophagitis present - has been taking famotidine on regular basis, but still having symptoms - was afraid to start omeprazole due to risk of bone loss and not being able to stop the medication - symptoms are present almost daily - has started to notice that drinking water and bending at the waist will cause reflux  4. Depression, unspecified depression type - No changes in symptoms.  - She reports she is well controlled on pristiq - she has stopped seeing her mental health provider and would like for me to take over the medication     All ROS negative with exception of what is listed above.   PHYSICAL EXAM Physical  Exam Vitals and nursing note reviewed.  Constitutional:      General: She is not in acute distress.    Appearance: Normal appearance.  HENT:     Head: Normocephalic.     Nose: Nose normal.  Eyes:     Extraocular Movements: Extraocular movements intact.     Conjunctiva/sclera: Conjunctivae normal.     Pupils: Pupils are equal, round, and reactive to light.  Neck:     Vascular: No carotid bruit.  Cardiovascular:     Rate and Rhythm: Normal rate and regular rhythm.     Pulses: Normal pulses.     Heart sounds: Normal heart sounds. No murmur heard. Pulmonary:     Effort: Pulmonary effort is normal.     Breath sounds: Normal breath sounds. No wheezing.  Abdominal:     General: Bowel sounds are normal. There is no distension.     Palpations: Abdomen is soft.     Tenderness: There is no abdominal tenderness. There is no guarding.  Musculoskeletal:        General: Normal range of motion.     Cervical back: Normal range of motion and neck supple.     Right lower leg: No edema.     Left lower leg: No edema.  Lymphadenopathy:     Cervical: No cervical adenopathy.  Skin:    General: Skin is warm and dry.     Capillary Refill: Capillary refill takes less than 2 seconds.  Neurological:  General: No focal deficit present.     Mental Status: She is alert and oriented to person, place, and time.  Psychiatric:        Mood and Affect: Mood normal.        Behavior: Behavior normal.        Thought Content: Thought content normal.        Judgment: Judgment normal.     PLAN Problem List Items Addressed This Visit     Gastroesophageal reflux disease    Not well controlled with famotidine use as needed. Discussed the limited risks of taking PPI treatment for short term to help reduce the gastric and esophageal irritation and allow for healing then stopping the medication. She is in agreement to this. She has omeprazole at home and will plan to start this today. No alarm sx present at this  time. If symptoms persist after 30-60 day PPI will refer to GI for further evaluation. Examination benign today.       Depression    Well controlled on pristiq with long term use and no dosage changes. Given that she is currently stable on the medication with no side effects, I am happy to take over prescribing for her at this time. If new symptoms develop she will let me know and we will plan to re-evaluate. No alarm symptoms present.       Relevant Medications   PRISTIQ 100 MG 24 hr tablet   Epistaxis - Primary    Chronically occurring for the past 5-6 months with no known trigger, usually about every 2 weeks. These are short lived and stop relatively quickly. She has not heard from ENT, therefore we will place another referral for her today to a different provider. No alarm symptoms present today and no bleeding present. Recommend trying humidified air in the bedroom at night to see if this is helpful for management.       Relevant Orders   Ambulatory referral to ENT   Chronic pansinusitis    No symptoms at this time, but historically chronic symptoms with difficulty treating. New issues with epistaxis as well. Will send referral to ENT for further evaluation. She has not heard from previous referral, therefore will place new one today.       Relevant Medications   azelastine (ASTELIN) 0.1 % nasal spray   Other Relevant Orders   Ambulatory referral to ENT      FOLLOW-UP Return if symptoms worsen or fail to improve.   Shawna Clamp, DNP, AGNP-c 10/01/2021 10:20 AM

## 2021-10-01 NOTE — Assessment & Plan Note (Signed)
Well controlled on pristiq with long term use and no dosage changes. Given that she is currently stable on the medication with no side effects, I am happy to take over prescribing for her at this time. If new symptoms develop she will let me know and we will plan to re-evaluate. No alarm symptoms present.

## 2021-10-01 NOTE — Assessment & Plan Note (Signed)
No symptoms at this time, but historically chronic symptoms with difficulty treating. New issues with epistaxis as well. Will send referral to ENT for further evaluation. She has not heard from previous referral, therefore will place new one today.

## 2021-10-01 NOTE — Patient Instructions (Addendum)
It was a pleasure seeing you today. I hope your time spent with [place] was pleasant and helpful. Please let us know if there is anything we can do to improve the service you receive.   I have sent in the referral to ENT again, please let me know if you do not hear from them by next week and I will check on this.   I sent in a years prescription for the Pristiq. If you have any issues please let me know.   If the reflux does not improve, please let me know.    Important Office Information Lab Results If labs were ordered, please note that you will see results through MyChart as soon as they come available from [facility].  It takes up to 5 business days for the results to be routed to me and for me to review them once all of the lab results have come through from [facility]. I will make recommendations based on your results and send these through MyChart or someone from the office will call you to discuss. If your labs are abnormal, we may contact you to schedule a visit to discuss the results and make recommendations.  If you have not heard from [place] within 5 business days or you have waited longer than a week and your lab results have not come through on MyChart, please feel free to call the office or send a message through MyChart to follow-up on these labs.   Referrals If referrals were placed today, the office where the referral was sent will contact you either by phone or through MyChart to set up scheduling. Please note that it can take up to a week for the referral office to contact you. If you do not hear from them in a week, please contact the referral office directly to inquire about scheduling.   Condition Treated If your condition worsens or you begin to have new symptoms, please schedule a follow-up appointment for further evaluation. If you are not sure if an appointment is needed, you may call the office to leave a message for the nurse and someone will contact you with recommendations.  If  you have an urgent or life threatening emergency, please do not call the office, but seek emergency evaluation by calling 911 or going to the nearest emergency room for evaluation.   MyChart and Phone Calls Please do not use MyChart for urgent messages. It may take up to 3 business days for MyChart messages to be read by staff and if they are unable to handle the request, an additional 3 business days for them to be routed to me and for my response.  Messages sent to the provider through MyChart do not come directly to the provider, please allow time for these messages to be routed and for me to respond.  We get a large volume of MyChart messages daily and these are responded to in the order received.   For urgent messages, please call the office at [phone] and speak with the front office staff or leave a message on the line of my assistant for guidance.  We are seeing patients from the hours of 8:00 am through 5:00 pm and calls directly to the nurse may not be answered immediately due to seeing patients, but your call will be returned as soon as possible.  Phone  messages received after 4:00 PM Monday through Thursday may not be returned until the following business day. Phone messages received after 11:00 AM on  Friday may not be returned until Monday.   After Hours We share on call hours with providers from other offices. If you have an urgent need after hours that cannot wait until the next business day, please contact the on call provider by calling the office number. A nurse will speak with you and contact the provider if needed for recommendations.  If you have an urgent or life threatening emergency after hours, please do not call the on call provider, but seek emergency evaluation by calling 911 or going to the nearest emergency room for evaluation.   Paperwork All paperwork requires a minimum of 5 days to complete and return to you or the designated personnel. Please keep this in mind  when bringing in forms or sending requests for paperwork completion to the office.

## 2021-10-01 NOTE — Assessment & Plan Note (Signed)
Not well controlled with famotidine use as needed. Discussed the limited risks of taking PPI treatment for short term to help reduce the gastric and esophageal irritation and allow for healing then stopping the medication. She is in agreement to this. She has omeprazole at home and will plan to start this today. No alarm sx present at this time. If symptoms persist after 30-60 day PPI will refer to GI for further evaluation. Examination benign today.

## 2021-10-01 NOTE — Assessment & Plan Note (Signed)
Chronically occurring for the past 5-6 months with no known trigger, usually about every 2 weeks. These are short lived and stop relatively quickly. She has not heard from ENT, therefore we will place another referral for her today to a different provider. No alarm symptoms present today and no bleeding present. Recommend trying humidified air in the bedroom at night to see if this is helpful for management.

## 2021-10-10 ENCOUNTER — Encounter (HOSPITAL_BASED_OUTPATIENT_CLINIC_OR_DEPARTMENT_OTHER): Payer: Self-pay | Admitting: Nurse Practitioner

## 2021-10-25 ENCOUNTER — Ambulatory Visit (HOSPITAL_BASED_OUTPATIENT_CLINIC_OR_DEPARTMENT_OTHER): Payer: Managed Care, Other (non HMO) | Admitting: Family Medicine

## 2021-10-25 ENCOUNTER — Encounter (HOSPITAL_BASED_OUTPATIENT_CLINIC_OR_DEPARTMENT_OTHER): Payer: Self-pay

## 2021-12-03 ENCOUNTER — Encounter (HOSPITAL_BASED_OUTPATIENT_CLINIC_OR_DEPARTMENT_OTHER): Payer: Self-pay | Admitting: Nurse Practitioner

## 2021-12-06 ENCOUNTER — Other Ambulatory Visit (HOSPITAL_BASED_OUTPATIENT_CLINIC_OR_DEPARTMENT_OTHER): Payer: Self-pay | Admitting: Nurse Practitioner

## 2021-12-06 DIAGNOSIS — K219 Gastro-esophageal reflux disease without esophagitis: Secondary | ICD-10-CM

## 2021-12-06 MED ORDER — OMEPRAZOLE 10 MG PO CPDR: 10.0000 mg | DELAYED_RELEASE_CAPSULE | Freq: Every day | ORAL | 3 refills | Status: DC

## 2022-02-19 ENCOUNTER — Other Ambulatory Visit: Payer: Self-pay | Admitting: Obstetrics & Gynecology

## 2022-02-19 DIAGNOSIS — Z1231 Encounter for screening mammogram for malignant neoplasm of breast: Secondary | ICD-10-CM

## 2022-03-17 ENCOUNTER — Encounter: Payer: Self-pay | Admitting: Nurse Practitioner

## 2022-03-17 ENCOUNTER — Encounter (HOSPITAL_BASED_OUTPATIENT_CLINIC_OR_DEPARTMENT_OTHER): Payer: Managed Care, Other (non HMO) | Admitting: Nurse Practitioner

## 2022-03-17 ENCOUNTER — Ambulatory Visit: Payer: Managed Care, Other (non HMO) | Admitting: Nurse Practitioner

## 2022-03-17 VITALS — BP 126/78 | HR 81 | Ht 62.25 in | Wt 150.6 lb

## 2022-03-17 DIAGNOSIS — M8580 Other specified disorders of bone density and structure, unspecified site: Secondary | ICD-10-CM | POA: Diagnosis not present

## 2022-03-17 DIAGNOSIS — Z0000 Encounter for general adult medical examination without abnormal findings: Secondary | ICD-10-CM | POA: Diagnosis not present

## 2022-03-17 DIAGNOSIS — Z8249 Family history of ischemic heart disease and other diseases of the circulatory system: Secondary | ICD-10-CM | POA: Diagnosis not present

## 2022-03-17 DIAGNOSIS — E7800 Pure hypercholesterolemia, unspecified: Secondary | ICD-10-CM | POA: Diagnosis not present

## 2022-03-17 DIAGNOSIS — R42 Dizziness and giddiness: Secondary | ICD-10-CM

## 2022-03-17 LAB — LIPID PANEL

## 2022-03-17 NOTE — Assessment & Plan Note (Signed)

## 2022-03-17 NOTE — Assessment & Plan Note (Signed)
Patient experiences lightheadedness upon standing, which occasionally triggers migraine aura. This has been chronic. No syncopal episodes. Dizziness lasts only a few seconds then spontaneously resolves. Suspect this is related to orthostatic hypotension. Previous cardiac work-up benign. Not worsening or changing at this time.  Plan:  Patient educated on the importance of rising slowly from a seated or lying position to mitigate symptoms.  - Suggest the use of compression stockings and increasing dietary salt intake to manage symptoms.  - Monitor for any worsening of symptoms and advise the patient to report any changes.

## 2022-03-17 NOTE — Progress Notes (Signed)
Miranda Keeler, DNP, AGNP-c [address] [place], [place] [address] Main Office [phone]  BP 126/78   Pulse 81   Ht 5' 2.25" (1.581 m)   Wt 150 lb 9.6 oz (68.3 kg)   LMP 01/28/1984   BMI 27.32 kg/m    Subjective:    Patient ID: Miranda Valentine, female    DOB: [birth year], 67 y.o.   MRN: [id number]  HPI: Miranda Valentine is a 67 y.o. female presenting on 03/17/2022 for comprehensive medical examination.   Current medical concerns include: Cardiac Evaluation: [name] has reconsidered the need for cardiac scoring due to her family history. She recalls a previous discussion on the topic and, given her father's history of a blocked artery and stent placement in the [place] artery at her current age, as well as her grandfather's death in his late 48s from a suspected heart attack or stroke, she now seeks to proceed with cardiac scoring for peace of mind. [name] experiences occasional lightheadedness when standing up, which she suspects may be related to orthostatic hypotension. This has been an intermittent issue for several years, and a past referral to a cardiologist yielded no significant findings.    IMMUNIZATIONS:   Flu: Flu vaccine completed elsewhere this season Prevnar 13: Prevnar 13 N/A for this patient Prevnar 20: Prevnar 20 N/A for this patient Pneumovax 23: Pneumovax 23 N/A for this patient Vac Shingrix: Shingrix N/A for this patient HPV: HPV N/A for this patient Tetanus: Tetanus completed in the last 10 years  HEALTH MAINTENANCE: Pap Smear HM Status: is up to date Mammogram HM Status: is up to date Colon Cancer Screening HM Status: is up to date Bone Density HM Status: is up to date STI Testing HM Status: is not applicable for this patient  She reports regular vision exams q1-5y: Yes  She reports regular dental exams q 10m  Yes  The patient eats a regular, healthy diet. She endorses exercise and/or activity of: [name]  resume walking to improve her weight and mental health. She plans to alternate between using a treadmill and walking outdoors, depending on weather and allergy conditions.   She currently: Marital Status: married Living situation: with family Sexual: monogamous Occupation: retired  Pertinent items are noted in HPI.  Most Recent Depression Screen:     03/17/2022    9:39 AM 08/20/2021    9:36 AM 06/07/2021    9:21 AM 06/01/2020   12:21 PM  Depression screen PHQ 2/9  Decreased Interest 0 0 0 0  Down, Depressed, Hopeless 0 0 0 0  PHQ - 2 Score 0 0 0 0  Altered sleeping  0    Tired, decreased energy  0    Change in appetite  0    Feeling bad or failure about yourself   0    Trouble concentrating  0    Moving slowly or fidgety/restless  0    Suicidal thoughts  0    PHQ-9 Score  0    Difficult doing work/chores  Not difficult at all     Most Recent Anxiety Screen:      No data to display         Most Recent Fall Screen:    03/17/2022    9:39 AM 10/01/2021   10:21 AM 08/20/2021    9:36 AM 03/15/2021   11:24 AM  Fall Risk   Falls in the past year? 0 1 0 0  Number falls in past yr: 0  0 0 0  Injury with Fall? 0 0 0 0  Risk for fall due to : No Fall Risks No Fall Risks No Fall Risks No Fall Risks  Follow up Falls evaluation completed Falls evaluation completed Falls evaluation completed Falls evaluation completed    Past medical history, surgical history, medications, allergies, family history and social history reviewed with patient today and changes made to appropriate areas of the chart.  Past Medical History:  Past Medical History:  Diagnosis Date   Anginal pain ([place]) 11/07/2011   Asthma    Cholecystitis    Depression    Dry eye    Migraines    Osteopenia    Ovarian cyst    benign   Positive TB test    skin test   Medications:  Current Outpatient Medications on File Prior to Visit  Medication Sig   azelastine (ASTELIN) 0.1 % nasal spray SMARTSIG:1-2 Spray(s)  Both Nares Twice Daily   Biotin 1 MG CAPS Take by mouth at bedtime.    cetirizine (ZYRTEC) 10 MG tablet Take 10 mg by mouth at bedtime.    estradiol (ESTRACE) 0.5 MG tablet Take 1 tablet (0.5 mg total) by mouth at bedtime.   montelukast (SINGULAIR) 10 MG tablet SMARTSIG:1 Tablet(s) By Mouth Every Evening   Multiple Vitamins-Minerals (MULTIVITAMINS THER. W/MINERALS) TABS Take 1 tablet by mouth at bedtime.    omeprazole (PRILOSEC) 10 MG capsule Take 1 capsule (10 mg total) by mouth daily.   PRISTIQ 100 MG 24 hr tablet Take 1 tablet (100 mg total) by mouth every morning.   Varenicline Tartrate (TYRVAYA NA) Place 1 Squirt into the nose 2 (two) times daily.   Vitamin D, Ergocalciferol, (DRISDOL) 1.25 MG (50000 UNIT) CAPS capsule Take 1 capsule (50,000 Units total) by mouth every 14 (fourteen) days.   zaleplon (SONATA) 10 MG capsule Take 1 capsule by mouth as needed.   XIIDRA 5 % SOLN ADMINISTER 1 DROP BOTH EYES TWICE DAILY (Patient not taking: Reported on 03/17/2022)   No current facility-administered medications on file prior to visit.   Surgical History:  Past Surgical History:  Procedure Laterality Date   ABDOMINAL HYSTERECTOMY  1982   TAH/RSO   CHOLECYSTECTOMY N/A 11/12/2018   Procedure: LAPAROSCOPIC CHOLECYSTECTOMY WITH INTRAOPERATIVE CHOLANGIOGRAM;  Surgeon: Jovita Kussmaul, MD;  Location: [facility];  Service: General;  Laterality: N/A;   LSO  1981   TONSILLECTOMY AND ADENOIDECTOMY  1997   Allergies:  No Known Allergies Family History:  Family History  Problem Relation Age of Onset   Cancer Mother        unclear primary-mets   Rheum arthritis Mother    Hypertension Father    Other Sister        rheumatoid factor positive   CVA Maternal Grandmother    CVA Maternal Grandfather    Parkinson's disease Maternal Grandfather    CVA Paternal Grandfather    Healthy Son        Objective:    BP 126/78   Pulse 81   Ht 5' 2.25" (1.581 m)   Wt 150 lb 9.6 oz (68.3 kg)   LMP 01/28/1984    BMI 27.32 kg/m   Wt Readings from Last 3 Encounters:  03/17/22 150 lb 9.6 oz (68.3 kg)  08/20/21 154 lb 6.4 oz (70 kg)  06/07/21 150 lb 6.4 oz (68.2 kg)    Physical Exam Vitals and nursing note reviewed.  Constitutional:      General: She is not in acute distress.  Appearance: Normal appearance.  HENT:     Head: Normocephalic and atraumatic.     Right Ear: Hearing, tympanic membrane, ear canal and external ear normal.     Left Ear: Hearing, tympanic membrane, ear canal and external ear normal.     Nose: Nose normal.     Right Sinus: No maxillary sinus tenderness or frontal sinus tenderness.     Left Sinus: No maxillary sinus tenderness or frontal sinus tenderness.     Mouth/Throat:     Lips: Pink.     Mouth: Mucous membranes are moist.     Pharynx: Oropharynx is clear.  Eyes:     General: Lids are normal. Vision grossly intact.     Extraocular Movements: Extraocular movements intact.     Conjunctiva/sclera: Conjunctivae normal.     Pupils: Pupils are equal, round, and reactive to light.     Funduscopic exam:    Right eye: Red reflex present.        Left eye: Red reflex present.    Visual Fields: Right eye visual fields normal and left eye visual fields normal.  Neck:     Thyroid: No thyromegaly.     Vascular: No carotid bruit.  Cardiovascular:     Rate and Rhythm: Normal rate and regular rhythm.     Chest Wall: PMI is not displaced.     Pulses: Normal pulses.          Dorsalis pedis pulses are 2+ on the right side and 2+ on the left side.       Posterior tibial pulses are 2+ on the right side and 2+ on the left side.     Heart sounds: Normal heart sounds. No murmur heard. Pulmonary:     Effort: Pulmonary effort is normal. No respiratory distress.     Breath sounds: Normal breath sounds.  Abdominal:     General: Abdomen is flat. Bowel sounds are normal. There is no distension.     Palpations: Abdomen is soft. There is no hepatomegaly, splenomegaly or mass.      Tenderness: There is no abdominal tenderness. There is no right CVA tenderness, left CVA tenderness, guarding or rebound.  Musculoskeletal:        General: Normal range of motion.     Cervical back: Full passive range of motion without pain, normal range of motion and neck supple. No tenderness.     Right lower leg: No edema.     Left lower leg: No edema.  Feet:     Left foot:     Toenail Condition: Left toenails are normal.  Lymphadenopathy:     Cervical: No cervical adenopathy.     Upper Body:     Right upper body: No supraclavicular adenopathy.     Left upper body: No supraclavicular adenopathy.  Skin:    General: Skin is warm and dry.     Capillary Refill: Capillary refill takes less than 2 seconds.     Nails: There is no clubbing.  Neurological:     General: No focal deficit present.     Mental Status: She is alert and oriented to person, place, and time.     GCS: GCS eye subscore is 4. GCS verbal subscore is 5. GCS motor subscore is 6.     Sensory: Sensation is intact.     Motor: Motor function is intact.     Coordination: Coordination is intact.     Gait: Gait is intact.     Deep Tendon  Reflexes: Reflexes are normal and symmetric.  Psychiatric:        Attention and Perception: Attention normal.        Mood and Affect: Mood normal.        Speech: Speech normal.        Behavior: Behavior normal. Behavior is cooperative.        Thought Content: Thought content normal.        Cognition and Memory: Cognition and memory normal.        Judgment: Judgment normal.     Results for orders placed or performed in visit on 06/07/21  Comprehensive metabolic panel  Result Value Ref Range   Glucose 87 70 - 99 mg/dL   BUN 11 8 - 27 mg/dL   Creatinine, Ser 0.70 0.57 - 1.00 mg/dL   eGFR 95 >59 mL/min/1.73   BUN/Creatinine Ratio 16 12 - 28   Sodium 139 134 - 144 mmol/L   Potassium 4.5 3.5 - 5.2 mmol/L   Chloride 101 96 - 106 mmol/L   CO2 21 20 - 29 mmol/L   Calcium 9.6 8.7 - 10.3  mg/dL   Total Protein 7.3 6.0 - 8.5 g/dL   Albumin 4.7 3.8 - 4.8 g/dL   Globulin, Total 2.6 1.5 - 4.5 g/dL   Albumin/Globulin Ratio 1.8 1.2 - 2.2   Bilirubin Total 0.3 0.0 - 1.2 mg/dL   Alkaline Phosphatase 84 44 - 121 IU/L   AST 19 0 - 40 IU/L   ALT 18 0 - 32 IU/L  CBC  Result Value Ref Range   WBC 4.2 3.4 - 10.8 x10E3/uL   RBC 4.57 3.77 - 5.28 x10E6/uL   Hemoglobin 13.8 11.1 - 15.9 g/dL   Hematocrit 40.6 34.0 - 46.6 %   MCV 89 79 - 97 fL   MCH 30.2 26.6 - 33.0 pg   MCHC 34.0 31.5 - 35.7 g/dL   RDW 12.5 11.7 - 15.4 %   Platelets 310 150 - 450 x10E3/uL  Lipid panel  Result Value Ref Range   Cholesterol, Total 224 (H) 100 - 199 mg/dL   Triglycerides 129 0 - 149 mg/dL   HDL 97 >39 mg/dL   VLDL Cholesterol Cal 22 5 - 40 mg/dL   LDL Chol Calc (NIH) 105 (H) 0 - 99 mg/dL   Chol/HDL Ratio 2.3 0.0 - 4.4 ratio  VITAMIN D 25 Hydroxy (Vit-D Deficiency, Fractures)  Result Value Ref Range   Vit D, 25-Hydroxy 39.9 30.0 - 100.0 ng/mL         Assessment & Plan:   Problem List Items Addressed This Visit     Family history of ASCVD (arteriosclerotic cardiovascular disease)     Patient has a family history of coronary artery disease, with her father having had a stent placed in the [place] artery and a grandfather who possibly died from a heart-related event.  Plan:  Order a cardiac CT scan for calcium scoring to assess the patient's risk for coronary artery disease.  Instructed patient to contact the imaging department at [facility] if they do not receive a call to schedule the CT scan. Will make changes to the plan of care as necessary based on findings.       Relevant Orders   CT CARDIAC SCORING (SELF PAY ONLY)   CBC with Differential/Platelet   Comprehensive metabolic panel   Lipid panel   VITAMIN D 25 Hydroxy (Vit-D Deficiency, Fractures)   TSH   Encounter for annual physical exam - Primary  CPE today with no abnormalities noted on exam.  Labs pending. Will make  changes as necessary based on results.  Review of HM activities and recommendations discussed and provided on AVS Anticipatory guidance, diet, and exercise recommendations provided.  Medications, allergies, and hx reviewed and updated as necessary.  Plan to f/u with CPE in 1 year or sooner for acute/chronic health needs as directed.        Relevant Orders   CBC with Differential/Platelet   Comprehensive metabolic panel   Lipid panel   VITAMIN D 25 Hydroxy (Vit-D Deficiency, Fractures)   TSH   Elevated LDL cholesterol level    History of elevated LDL cholesterol with last monitoring. At that time HDL levels were excellent and risk was considered low. Recommend close monitoring given family history of ASCVD. No current symptoms.  The 10-year ASCVD risk score (Arnett DK, et al., 2019) is: 5.8%  Plan: Labs today Cardiac calcium score ordered Consider statin therapy based on risks.       Relevant Orders   CBC with Differential/Platelet   Comprehensive metabolic panel   Lipid panel   VITAMIN D 25 Hydroxy (Vit-D Deficiency, Fractures)   TSH   Orthostatic dizziness    Patient experiences lightheadedness upon standing, which occasionally triggers migraine aura. This has been chronic. No syncopal episodes. Dizziness lasts only a few seconds then spontaneously resolves. Suspect this is related to orthostatic hypotension. Previous cardiac work-up benign. Not worsening or changing at this time.  Plan:  Patient educated on the importance of rising slowly from a seated or lying position to mitigate symptoms.  - Suggest the use of compression stockings and increasing dietary salt intake to manage symptoms.  - Monitor for any worsening of symptoms and advise the patient to report any changes.      Relevant Orders   CBC with Differential/Platelet   Comprehensive metabolic panel   Lipid panel   VITAMIN D 25 Hydroxy (Vit-D Deficiency, Fractures)   TSH   Other Visit Diagnoses     Health  care maintenance       Relevant Orders   CBC with Differential/Platelet   Comprehensive metabolic panel   Lipid panel   VITAMIN D 25 Hydroxy (Vit-D Deficiency, Fractures)   TSH   Osteopenia, unspecified location       Relevant Orders   CBC with Differential/Platelet   Comprehensive metabolic panel   Lipid panel   VITAMIN D 25 Hydroxy (Vit-D Deficiency, Fractures)   TSH          Follow up plan: Return in about 1 year (around 03/18/2023) for CPE.  NEXT PREVENTATIVE PHYSICAL DUE IN 1 YEAR.  PATIENT COUNSELING PROVIDED FOR ALL ADULT PATIENTS:  Consume a well balanced diet low in saturated fats, cholesterol, and moderation in carbohydrates.   This can be as simple as monitoring portion sizes and cutting back on sugary beverages such as soda and juice to start with.    Daily water consumption of at least 64 ounces.  Physical activity at least 180 minutes per week, if just starting out.   This can be as simple as taking the stairs instead of the elevator and walking 2-3 laps around the office  purposefully every day.   STD protection, partner selection, and regular testing if high risk.  Limited consumption of alcoholic beverages if alcohol is consumed.  For women, I recommend no more than 7 alcoholic beverages per week, spread out throughout the week.  Avoid "binge" drinking or consuming large quantities of  alcohol in one setting.   Please let me know if you feel you may need help with reduction or quitting alcohol consumption.   Avoidance of nicotine, if used.  Please let me know if you feel you may need help with reduction or quitting nicotine use.   Daily mental health attention.  This can be in the form of 5 minute daily meditation, prayer, journaling, yoga, reflection, etc.   Purposeful attention to your emotions and mental state can significantly improve your overall wellbeing and Health.  Please know that I am here to help you with all of your health care goals and am  happy to work with you to find a solution that works best for you.  The greatest advice I have received with any changes in life are to take it one step at a time, that even means if all you can focus on is the next 60 seconds, then do that and celebrate your victories.  With any changes in life, you will have set backs, and that is OK. The important thing to remember is, if you have a set back, it is not a failure, it is an opportunity to try again!  Health Maintenance Recommendations Screening Testing Mammogram Every 1 -2 years based on history and risk factors Starting at age 64 Pap Smear Ages 21-39 every 3 years Ages 70-65 every 5 years with HPV testing More frequent testing may be required based on results and history Colon Cancer Screening Every 1-10 years based on test performed, risk factors, and history Starting at age 33 Bone Density Screening Every 2-10 years based on history Starting at age 58 for women Recommendations for men differ based on medication usage, history, and risk factors AAA Screening One time ultrasound Men 62-19 years old who have every smoked Lung Cancer Screening Low Dose Lung CT every 12 months Age 29-80 years with a 30 pack-year smoking history who still smoke or who have quit within the last 15 years  Screening Labs Routine  Labs: Complete Blood Count (CBC), Complete Metabolic Panel (CMP), Cholesterol (Lipid Panel) Every 6-12 months based on history and medications May be recommended more frequently based on current conditions or previous results Hemoglobin A1c Lab Every 3-12 months based on history and previous results Starting at age 66 or earlier with diagnosis of diabetes, high cholesterol, BMI >26, and/or risk factors Frequent monitoring for patients with diabetes to ensure blood sugar control Thyroid Panel (TSH w/ T3 & T4) Every 6 months based on history, symptoms, and risk factors May be repeated more often if on medication HIV One time  testing for all patients 40 and older May be repeated more frequently for patients with increased risk factors or exposure Hepatitis C One time testing for all patients 93 and older May be repeated more frequently for patients with increased risk factors or exposure Gonorrhea, Chlamydia Every 12 months for all sexually active persons 13-24 years Additional monitoring may be recommended for those who are considered high risk or who have symptoms PSA Men 2-63 years old with risk factors Additional screening may be recommended from age 15-69 based on risk factors, symptoms, and history  Vaccine Recommendations Tetanus Booster All adults every 10 years Flu Vaccine All patients 6 months and older every year COVID Vaccine All patients 12 years and older Initial dosing with booster May recommend additional booster based on age and health history HPV Vaccine 2 doses all patients age 66-26 Dosing may be considered for patients over 26 Shingles  Vaccine (Shingrix) 2 doses all adults 85 years and older Pneumonia (Pneumovax 23) All adults 61 years and older May recommend earlier dosing based on health history Pneumonia (Prevnar 13) All adults 47 years and older Dosed 1 year after Pneumovax 23  Additional Screening, Testing, and Vaccinations may be recommended on an individualized basis based on family history, health history, risk factors, and/or exposure.

## 2022-03-17 NOTE — Patient Instructions (Addendum)
Dear Margarita Grizzle,  Thank you for your visit today, it was so good to see you. Here are the instructions and recommendations based on our consultation:  1. A referral for a Cardiac CT scan has been made. Should you not receive a call from the imaging department, please contact the [place] imaging department to arrange your appointment.  2. Please continue to monitor any instances of lightheadedness when you move from a sitting to a standing position. Notify us if these symptoms intensify.  3. To manage orthostatic hypotension, consider the use of compression stockings and a slight increase in your salt intake.  4. Stay hydrated and engage in regular walking to aid with constipation and enhance your overall health.  5. For fiber supplementation, continue with your fiber supplements. Begin with a lower dosage and incrementally increase to prevent gas.  6. As needed, use a stool softener such as Colace (docusate sodium) for firmer stools.  7. We will be conducting blood work, which will include an assessment of your vitamin D levels.  8. Should you require medication refills after returning home, do not hesitate to contact our office.  Do not hesitate to reach out with any further questions or concerns. We are here to assist you on your path to better health.  Warm regards,  SaraBeth Lillie Bollig, DNP, AGNP-c

## 2022-03-17 NOTE — Assessment & Plan Note (Signed)
History of elevated LDL cholesterol with last monitoring. At that time HDL levels were excellent and risk was considered low. Recommend close monitoring given family history of ASCVD. No current symptoms.  The 10-year ASCVD risk score (Arnett DK, et al., 2019) is: 5.8%  Plan: Labs today Cardiac calcium score ordered Consider statin therapy based on risks.

## 2022-03-17 NOTE — Assessment & Plan Note (Signed)
Patient has a family history of coronary artery disease, with her father having had a stent placed in the [place] artery and a grandfather who possibly died from a heart-related event.  Plan:  Order a cardiac CT scan for calcium scoring to assess the patient's risk for coronary artery disease.  Instructed patient to contact the imaging department at [facility] if they do not receive a call to schedule the CT scan. Will make changes to the plan of care as necessary based on findings.

## 2022-03-18 LAB — LIPID PANEL
Chol/HDL Ratio: 2.2 ratio (ref 0.0–4.4)
Cholesterol, Total: 194 mg/dL (ref 100–199)
HDL: 90 mg/dL (ref >39)
LDL Chol Calc (NIH): 86 mg/dL (ref 0–99)
Triglycerides: 105 mg/dL (ref 0–149)
VLDL Cholesterol Cal: 18 mg/dL (ref 5–40)

## 2022-03-18 LAB — COMPREHENSIVE METABOLIC PANEL
ALT: 17 IU/L (ref 0–32)
AST: 13 IU/L (ref 0–40)
Albumin/Globulin Ratio: 2 (ref 1.2–2.2)
Albumin: 4.5 g/dL (ref 3.9–4.9)
Alkaline Phosphatase: 90 IU/L (ref 44–121)
BUN/Creatinine Ratio: 13 (ref 12–28)
BUN: 10 mg/dL (ref 8–27)
Bilirubin Total: 0.3 mg/dL (ref 0.0–1.2)
CO2: 23 mmol/L (ref 20–29)
Calcium: 9.5 mg/dL (ref 8.7–10.3)
Chloride: 103 mmol/L (ref 96–106)
Creatinine, Ser: 0.75 mg/dL (ref 0.57–1.00)
Globulin, Total: 2.2 g/dL (ref 1.5–4.5)
Glucose: 87 mg/dL (ref 70–99)
Potassium: 4.9 mmol/L (ref 3.5–5.2)
Sodium: 140 mmol/L (ref 134–144)
Total Protein: 6.7 g/dL (ref 6.0–8.5)
eGFR: 87 mL/min/{1.73_m2} (ref >59)

## 2022-03-18 LAB — TSH: TSH: 1.15 u[IU]/mL (ref 0.450–4.500)

## 2022-03-18 LAB — CBC WITH DIFFERENTIAL/PLATELET
Basophils Absolute: 0 10*3/uL (ref 0.0–0.2)
Basos: 0 %
EOS (ABSOLUTE): 0.1 10*3/uL (ref 0.0–0.4)
Eos: 2 %
Hematocrit: 39.3 % (ref 34.0–46.6)
Hemoglobin: 13 g/dL (ref 11.1–15.9)
Immature Grans (Abs): 0 10*3/uL (ref 0.0–0.1)
Immature Granulocytes: 0 %
Lymphocytes Absolute: 1.2 10*3/uL (ref 0.7–3.1)
Lymphs: 34 %
MCH: 29.2 pg (ref 26.6–33.0)
MCHC: 33.1 g/dL (ref 31.5–35.7)
MCV: 88 fL (ref 79–97)
Monocytes Absolute: 0.3 10*3/uL (ref 0.1–0.9)
Monocytes: 8 %
Neutrophils Absolute: 2 10*3/uL (ref 1.4–7.0)
Neutrophils: 56 %
Platelets: 309 10*3/uL (ref 150–450)
RBC: 4.45 x10E6/uL (ref 3.77–5.28)
RDW: 12.6 % (ref 11.7–15.4)
WBC: 3.7 10*3/uL (ref 3.4–10.8)

## 2022-03-18 LAB — VITAMIN D 25 HYDROXY (VIT D DEFICIENCY, FRACTURES): Vit D, 25-Hydroxy: 44.7 ng/mL (ref 30.0–100.0)

## 2022-04-11 ENCOUNTER — Ambulatory Visit
Admission: RE | Admit: 2022-04-11 | Discharge: 2022-04-11 | Disposition: A | Discharge status: Home / Self Care | Payer: Managed Care, Other (non HMO) | Source: Ambulatory Visit | Attending: Obstetrics & Gynecology | Admitting: Obstetrics & Gynecology

## 2022-04-11 DIAGNOSIS — Z1231 Encounter for screening mammogram for malignant neoplasm of breast: Secondary | ICD-10-CM

## 2022-04-21 ENCOUNTER — Encounter (HOSPITAL_BASED_OUTPATIENT_CLINIC_OR_DEPARTMENT_OTHER): Payer: Self-pay

## 2022-04-21 ENCOUNTER — Ambulatory Visit (HOSPITAL_BASED_OUTPATIENT_CLINIC_OR_DEPARTMENT_OTHER)
Admission: RE | Admit: 2022-04-21 | Discharge: 2022-04-21 | Disposition: A | Discharge status: Home / Self Care | Payer: Managed Care, Other (non HMO) | Source: Ambulatory Visit | Attending: Nurse Practitioner | Admitting: Nurse Practitioner

## 2022-04-21 DIAGNOSIS — Z8249 Family history of ischemic heart disease and other diseases of the circulatory system: Secondary | ICD-10-CM | POA: Insufficient documentation

## 2022-05-23 ENCOUNTER — Ambulatory Visit: Payer: Managed Care, Other (non HMO) | Admitting: Medical

## 2022-05-23 ENCOUNTER — Encounter: Payer: Self-pay | Admitting: Medical

## 2022-05-23 VITALS — BP 132/70 | HR 70 | Temp 98.1°F | Ht 63.0 in | Wt 151.4 lb

## 2022-05-23 DIAGNOSIS — J019 Acute sinusitis, unspecified: Secondary | ICD-10-CM | POA: Diagnosis not present

## 2022-05-23 MED ORDER — AMOXICILLIN 875 MG PO TABS: 875.0000 mg | ORAL_TABLET | Freq: Two times a day (BID) | ORAL | 0 refills | Status: AC

## 2022-05-23 NOTE — Progress Notes (Signed)
Subjective:  Miranda Valentine is a 67 y.o. female who presents for Chief Complaint  Patient presents with   Facial Pain    Cough, sinus pain and pressure that started last Sunday after working in the yard.      Here for possible sinus infection.  Having sinus pressure, cough, gone into chest now.  Started after working out in the yard last week.   No sneezing.   Has some post nasal drainage, head congestion.  This morning was painful to use her Flonase and nasal saline.   No fever, no body aches or chills.  No nausea, no vomiting.   Getting some mucous with cough.  Mild scratchy throat.  Using her allergy medication daily, Singulair, zyrtec, nasal saline and Flonase.  Also using some mucinex.  Has not done a covid test.  No other aggravating or relieving factors.    No other c/o.  Past Medical History:  Diagnosis Date   Anginal pain (HCC) 11/07/2011   Asthma    Cholecystitis    Depression    Dry eye    Migraines    Osteopenia    Ovarian cyst    benign   Positive TB test    skin test   Current Outpatient Medications on File Prior to Visit  Medication Sig Dispense Refill   Biotin 1 MG CAPS Take by mouth at bedtime.      cetirizine (ZYRTEC) 10 MG tablet Take 10 mg by mouth at bedtime.      estradiol (ESTRACE) 0.5 MG tablet Take 1 tablet (0.5 mg total) by mouth at bedtime. 90 tablet 3   fluticasone (FLONASE) 50 MCG/ACT nasal spray Place 2 sprays into both nostrils daily.     guaiFENesin (MUCINEX PO) Take 1 tablet by mouth as needed.     montelukast (SINGULAIR) 10 MG tablet SMARTSIG:1 Tablet(s) By Mouth Every Evening     Multiple Vitamins-Minerals (MULTIVITAMINS THER. W/MINERALS) TABS Take 1 tablet by mouth at bedtime.      omeprazole (PRILOSEC) 10 MG capsule Take 1 capsule (10 mg total) by mouth daily. 90 capsule 3   PRISTIQ 100 MG 24 hr tablet Take 1 tablet (100 mg total) by mouth every morning. 90 tablet 3   sodium chloride (OCEAN) 0.65 % SOLN nasal spray Place 1 spray into both  nostrils as needed for congestion.     Varenicline Tartrate (TYRVAYA NA) Place 1 Squirt into the nose 2 (two) times daily.     Vitamin D, Ergocalciferol, (DRISDOL) 1.25 MG (50000 UNIT) CAPS capsule Take 1 capsule (50,000 Units total) by mouth every 14 (fourteen) days. 6 capsule 3   XIIDRA 5 % SOLN      zaleplon (SONATA) 10 MG capsule Take 1 capsule by mouth as needed. (Patient not taking: Reported on 05/23/2022)  5   No current facility-administered medications on file prior to visit.    The following portions of the patient's history were reviewed and updated as appropriate: allergies, current medications, past family history, past medical history, past social history, past surgical history and problem list.  ROS Otherwise as in subjective above  Objective: BP 132/70   Pulse 70   Temp 98.1 F (36.7 C) (Tympanic)   Ht 5\' 3"  (1.6 m)   Wt 151 lb 6.4 oz (68.7 kg)   LMP 01/28/1984   SpO2 98%   BMI 26.82 kg/m   General appearance: alert, no distress, well developed, well nourished HEENT: normocephalic, sclerae anicteric, conjunctiva pink and moist, TMs pearly, nares  with some erythema, no discharge  pharynx normal Oral cavity: MMM, no lesions Neck: supple, no lymphadenopathy, no thyromegaly, no masses Lungs: CTA bilaterally, no wheezes, rhonchi, or rales    Assessment: Encounter Diagnosis  Name Primary?   Acute sinusitis, recurrence not specified, unspecified location Yes     Plan: Advised rest, hydration, nasal saline continue, begin amoxicillin below.  Continue your usual allergy medicine regimen that you do in general.  If worse or not much improved within the next 4 to 5 days then call or recheck.   Miranda Valentine was seen today for facial pain.  Diagnoses and all orders for this visit:  Acute sinusitis, recurrence not specified, unspecified location  Other orders -     amoxicillin (AMOXIL) 875 MG tablet; Take 1 tablet (875 mg total) by mouth 2 (two) times daily for 10  days.    Follow up: prn

## 2022-06-20 ENCOUNTER — Ambulatory Visit (HOSPITAL_BASED_OUTPATIENT_CLINIC_OR_DEPARTMENT_OTHER): Payer: Managed Care, Other (non HMO) | Admitting: Obstetrics & Gynecology

## 2022-06-20 ENCOUNTER — Encounter (HOSPITAL_BASED_OUTPATIENT_CLINIC_OR_DEPARTMENT_OTHER): Payer: Self-pay | Admitting: Obstetrics & Gynecology

## 2022-06-20 VITALS — BP 122/64 | HR 71 | Ht 63.0 in | Wt 152.4 lb

## 2022-06-20 DIAGNOSIS — M85852 Other specified disorders of bone density and structure, left thigh: Secondary | ICD-10-CM

## 2022-06-20 DIAGNOSIS — Z90710 Acquired absence of both cervix and uterus: Secondary | ICD-10-CM | POA: Diagnosis not present

## 2022-06-20 DIAGNOSIS — Z79890 Hormone replacement therapy (postmenopausal): Secondary | ICD-10-CM | POA: Diagnosis not present

## 2022-06-20 DIAGNOSIS — Z01419 Encounter for gynecological examination (general) (routine) without abnormal findings: Secondary | ICD-10-CM | POA: Diagnosis not present

## 2022-06-20 MED ORDER — ESTRADIOL 0.5 MG PO TABS: 0.5000 mg | ORAL_TABLET | Freq: Every day | ORAL | 3 refills | Status: DC

## 2022-06-20 MED ORDER — VITAMIN D (ERGOCALCIFEROL) 1.25 MG (50000 UNIT) PO CAPS: 50000.0000 [IU] | ORAL_CAPSULE | ORAL | 3 refills | Status: DC

## 2022-06-20 NOTE — Progress Notes (Unsigned)
67 y.o. G1P1 Married White or Caucasian female here for annual exam.  Doing well.  Going to help with keeping twin 9 year old granddaughters three days a week.    Denies vaginal bleeding.  On HRT.  Discussed dosing and possibly decreasing.  No issues.  Decided to stay where she is for now.  Risks reviewed including more updated information for estrogen only use.  In particular, stroke risk discussed.  Had coronary CT 04/21/2022.  Scoring was 0!!  Patient's last menstrual period was 01/28/1984.          Sexually active: Yes.    The current method of family planning is status post hysterectomy.    Smoker:  no  Health Maintenance: Pap:  2018.  Not indicated. History of abnormal Pap:  no MMG:  03/2022 Colonoscopy:  09/2015, follow up 5 years.  Referral made last year.  Pt is going to try and do this in 2024 BMD:   05/2020 Screening Labs: Lab work done 02/2022   reports that she has never smoked. She has never used smokeless tobacco. She reports current alcohol use of about 5.0 standard drinks of alcohol per week. She reports that she does not use drugs.  Past Medical History:  Diagnosis Date   Anginal pain (HCC) 11/07/2011   Asthma    Cholecystitis    Depression    Dry eye    Migraines    Osteopenia    Ovarian cyst    benign   Positive TB test    skin test    Past Surgical History:  Procedure Laterality Date   ABDOMINAL HYSTERECTOMY  1982   TAH/RSO   CHOLECYSTECTOMY N/A 11/12/2018   Procedure: LAPAROSCOPIC CHOLECYSTECTOMY WITH INTRAOPERATIVE CHOLANGIOGRAM;  Surgeon: Griselda Miner, MD;  Location: [facility];  Service: General;  Laterality: N/A;   LSO  1981   TONSILLECTOMY AND ADENOIDECTOMY  1997    Current Outpatient Medications  Medication Sig Dispense Refill   Biotin 1 MG CAPS Take by mouth at bedtime.      cetirizine (ZYRTEC) 10 MG tablet Take 10 mg by mouth at bedtime.      estradiol (ESTRACE) 0.5 MG tablet Take 1 tablet (0.5 mg total) by mouth at bedtime. 90 tablet 3    fluticasone (FLONASE) 50 MCG/ACT nasal spray Place 2 sprays into both nostrils daily.     guaiFENesin (MUCINEX PO) Take 1 tablet by mouth as needed.     montelukast (SINGULAIR) 10 MG tablet SMARTSIG:1 Tablet(s) By Mouth Every Evening     Multiple Vitamins-Minerals (MULTIVITAMINS THER. W/MINERALS) TABS Take 1 tablet by mouth at bedtime.      omeprazole (PRILOSEC) 10 MG capsule Take 1 capsule (10 mg total) by mouth daily. 90 capsule 3   PRISTIQ 100 MG 24 hr tablet Take 1 tablet (100 mg total) by mouth every morning. 90 tablet 3   sodium chloride (OCEAN) 0.65 % SOLN nasal spray Place 1 spray into both nostrils as needed for congestion.     Varenicline Tartrate (TYRVAYA NA) Place 1 Squirt into the nose 2 (two) times daily.     Vitamin D, Ergocalciferol, (DRISDOL) 1.25 MG (50000 UNIT) CAPS capsule Take 1 capsule (50,000 Units total) by mouth every 14 (fourteen) days. 6 capsule 3   XIIDRA 5 % SOLN      zaleplon (SONATA) 10 MG capsule Take 1 capsule by mouth as needed.  5   No current facility-administered medications for this visit.    Family History  Problem Relation  Age of Onset   Cancer Mother        unclear primary-mets   Rheum arthritis Mother    Hypertension Father    Other Sister        rheumatoid factor positive   CVA Maternal Grandmother    CVA Maternal Grandfather    Parkinson's disease Maternal Grandfather    CVA Paternal Grandfather    Healthy Son     ROS: Constitutional: negative Genitourinary:negative  Exam:   BP 122/64 (BP Location: Left Arm, Patient Position: Sitting, Cuff Size: Large)   Pulse 71   Ht 5\' 3"  (1.6 m) Comment: Reported'  Wt 152 lb 6.4 oz (69.1 kg)   LMP 01/28/1984   BMI 27.00 kg/m   Height: 5\' 3"  (160 cm) (Reported')  General appearance: alert, cooperative and appears stated age Head: Normocephalic, without obvious abnormality, atraumatic Neck: no adenopathy, supple, symmetrical, trachea midline and thyroid normal to inspection and  palpation Lungs: clear to auscultation bilaterally Breasts: normal appearance, no masses or tenderness Heart: regular rate and rhythm Abdomen: soft, non-tender; bowel sounds normal; no masses,  no organomegaly Extremities: extremities normal, atraumatic, no cyanosis or edema Skin: Skin color, texture, turgor normal. No rashes or lesions Lymph nodes: Cervical, supraclavicular, and axillary nodes normal. No abnormal inguinal nodes palpated Neurologic: Grossly normal   Pelvic: External genitalia:  no lesions              Urethra:  normal appearing urethra with no masses, tenderness or lesions              Bartholins and Skenes: normal                 Vagina: normal appearing vagina with normal color and no discharge, no lesions              Cervix: absent              Pap taken: No. Bimanual Exam:  Uterus:  uterus absent              Adnexa: no mass, fullness, tenderness               Rectovaginal: Confirms               Anus:  normal sphincter tone, no lesions  Chaperone, Ina Homes, CMA, was present for exam.  Assessment/Plan: 1. Well woman exam with routine gynecological exam - Pap smear not indicated due to TAh - Mammogram 03/2022 - Colonoscopy 09/2015.  She is aware this is due.  Referral was made and she has to get records to [place] - Bone mineral density 05/2020 - lab work done with PCP, [place] Dec Early 02/2022 - vaccines reviewed/updated  2. Postmenopausal HRT (hormone replacement therapy) - will continue with current dosing - estradiol (ESTRACE) 0.5 MG tablet; Take 1 tablet (0.5 mg total) by mouth at bedtime.  Dispense: 90 tablet; Refill: 3  3. Osteopenia of neck of left femur - Vitamin D, Ergocalciferol, (DRISDOL) 1.25 MG (50000 UNIT) CAPS capsule; Take 1 capsule (50,000 Units total) by mouth every 14 (fourteen) days.  Dispense: 6 capsule; Refill: 3  4. S/P TAH (total abdominal hysterectomy) - also had RSO at time of surgery

## 2022-06-21 DIAGNOSIS — M85852 Other specified disorders of bone density and structure, left thigh: Secondary | ICD-10-CM | POA: Insufficient documentation

## 2022-06-21 DIAGNOSIS — Z79890 Hormone replacement therapy (postmenopausal): Secondary | ICD-10-CM | POA: Insufficient documentation

## 2022-06-25 ENCOUNTER — Encounter: Payer: Self-pay | Admitting: Nurse Practitioner

## 2022-06-25 ENCOUNTER — Ambulatory Visit: Payer: Managed Care, Other (non HMO) | Admitting: Nurse Practitioner

## 2022-06-25 VITALS — BP 142/84 | HR 82 | Wt 151.2 lb

## 2022-06-25 DIAGNOSIS — R519 Headache, unspecified: Secondary | ICD-10-CM

## 2022-06-25 MED ORDER — KETOROLAC TROMETHAMINE 60 MG/2ML IM SOLN
60.0000 mg | Freq: Once | INTRAMUSCULAR | Status: AC
Start: 2022-06-25 — End: 2022-06-25
  Administered 2022-06-25: 60 mg via INTRAMUSCULAR

## 2022-06-25 MED ORDER — METHYLPREDNISOLONE SODIUM SUCC 125 MG IJ SOLR
125.0000 mg | Freq: Once | INTRAMUSCULAR | Status: AC
Start: 2022-06-25 — End: 2022-06-25
  Administered 2022-06-25: 62.5 mg via INTRAMUSCULAR

## 2022-06-25 NOTE — Assessment & Plan Note (Signed)
Unspecified headache type with prolonged course that improves with OTC medication treatment, but returns as the medication wears off. Neuro exam normal today. Cardiac exam normal with exception of slightly elevated BP. At this time it is unclear if the anxiety associated with the headache is triggering the elevation or if this is a cause of the headache. Will plan to treat today with toradol and dexamethasone IM and samples of Ubrelvy provided for home use. Patient instructed to contact the office if the headache is not improved by tomorrow or seek emergency care if any alarm symptoms present.

## 2022-06-25 NOTE — Progress Notes (Signed)
Tollie Eth, DNP, AGNP-c [facility] [address] [place], [place] [address] [phone]  Subjective:   Miranda Valentine is a 67 y.o. female presents to day for evaluation of: Headache and vein on temple.   Miranda Valentine presents today with a persistent unilateral headache on the left side near the temple region.  She recently noticed a swollen and protruding vein from the same area which caused concerns for alarm that this could be more than a typical headache.  The headache has been ongoing since Monday.  She does find relief with the use of over-the-counter medications however the symptoms do return.  She does have a history of migraines and dry eyes however this headache feels different than her usual migraines.  She does have elevated blood pressure at this time which is not her norm.  She also mentions a new symptoms of soreness and erythema to her left nipple which is unusual.  PMH, Medications, and Allergies reviewed and updated in chart as appropriate.   ROS negative except for what is listed in HPI. Objective:  BP (!) 142/84   Pulse 82   Wt 151 lb 3.2 oz (68.6 kg)   LMP 01/28/1984   BMI 26.78 kg/m  Physical Exam Vitals and nursing note reviewed.  Constitutional:      General: She is not in acute distress.    Appearance: Normal appearance. She is not ill-appearing or diaphoretic.  HENT:     Head: Atraumatic. No right periorbital erythema or left periorbital erythema.     Jaw: No trismus, tenderness or swelling.     Salivary Glands: Right salivary gland is not diffusely enlarged or tender. Left salivary gland is not diffusely enlarged or tender.      Comments: Visible vein to the left temple. No tenderness with palpation to the temples, forehead, or sinuses.   Area of reported headache outlined in red.     Right Ear: No middle ear effusion.     Left Ear: A middle ear effusion is present.     Nose: Nose normal.     Mouth/Throat:     Mouth: Mucous  membranes are moist.     Pharynx: Uvula midline.  Eyes:     General: Lids are normal. Vision grossly intact. Gaze aligned appropriately. No visual field deficit or scleral icterus.    Extraocular Movements:     Right eye: Normal extraocular motion and no nystagmus.     Left eye: Normal extraocular motion and no nystagmus.  Neck:     Vascular: No carotid bruit.  Cardiovascular:     Rate and Rhythm: Normal rate and regular rhythm.     Heart sounds: Normal heart sounds.  Pulmonary:     Effort: Pulmonary effort is normal.     Breath sounds: Normal breath sounds and air entry.  Musculoskeletal:     Cervical back: Full passive range of motion without pain. No edema. No pain with movement or spinous process tenderness. Normal range of motion.  Lymphadenopathy:     Cervical:     Right cervical: No superficial cervical adenopathy.    Left cervical: No superficial cervical adenopathy.  Neurological:     General: No focal deficit present.     Mental Status: She is alert and oriented to person, place, and time.     GCS: GCS eye subscore is 4. GCS verbal subscore is 5. GCS motor subscore is 6.     Cranial Nerves: Cranial nerves 2-12 are intact.  Sensory: Sensation is intact.     Motor: Motor function is intact.     Coordination: Coordination is intact.     Gait: Gait is intact.           Assessment & Plan:   Problem List Items Addressed This Visit     Acute nonintractable headache - Primary    Unspecified headache type with prolonged course that improves with OTC medication treatment, but returns as the medication wears off. Neuro exam normal today. Cardiac exam normal with exception of slightly elevated BP. At this time it is unclear if the anxiety associated with the headache is triggering the elevation or if this is a cause of the headache. Will plan to treat today with toradol and dexamethasone IM and samples of Ubrelvy provided for home use. Patient instructed to contact the  office if the headache is not improved by tomorrow or seek emergency care if any alarm symptoms present.          Tollie Eth, DNP, AGNP-c 06/25/2022  7:18 PM    History, Medications, Surgery, SDOH, and Family History reviewed and updated as appropriate.

## 2022-09-01 ENCOUNTER — Other Ambulatory Visit: Payer: Self-pay | Admitting: Nurse Practitioner

## 2022-09-01 DIAGNOSIS — F5101 Primary insomnia: Secondary | ICD-10-CM

## 2022-09-02 NOTE — Telephone Encounter (Signed)
Refill request I did not see were you had filled this before last apt. 06/25/22.

## 2022-09-03 MED ORDER — ZALEPLON 10 MG PO CAPS
10.0000 mg | ORAL_CAPSULE | Freq: Every evening | ORAL | 1 refills | Status: DC | PRN
Start: 2022-09-03 — End: 2022-10-02

## 2022-10-02 ENCOUNTER — Ambulatory Visit: Payer: Managed Care, Other (non HMO) | Admitting: Nurse Practitioner

## 2022-10-02 ENCOUNTER — Other Ambulatory Visit: Payer: Self-pay | Admitting: Nurse Practitioner

## 2022-10-02 ENCOUNTER — Encounter: Payer: Self-pay | Admitting: Nurse Practitioner

## 2022-10-02 VITALS — BP 132/78 | HR 61 | Wt 151.2 lb

## 2022-10-02 DIAGNOSIS — G43809 Other migraine, not intractable, without status migrainosus: Secondary | ICD-10-CM

## 2022-10-02 DIAGNOSIS — I10 Essential (primary) hypertension: Secondary | ICD-10-CM | POA: Diagnosis not present

## 2022-10-02 MED ORDER — PROPRANOLOL HCL 10 MG PO TABS
10.0000 mg | ORAL_TABLET | Freq: Three times a day (TID) | ORAL | 1 refills | Status: DC
Start: 2022-10-02 — End: 2022-10-03

## 2022-10-02 MED ORDER — UBRELVY 100 MG PO TABS
ORAL_TABLET | ORAL | 1 refills | Status: DC
Start: 2022-10-02 — End: 2022-11-19

## 2022-10-02 NOTE — Patient Instructions (Signed)
Orthostatic Hypotension Blood pressure is a measurement of how strongly, or weakly, your circulating blood is pressing against the walls of your arteries. Orthostatic hypotension is a drop in blood pressure that can happen when you change positions, such as when you go from lying down to standing. Arteries are blood vessels that carry blood from your heart throughout your body. When blood pressure is too low, you may not get enough blood to your brain or to the rest of your organs. Orthostatic hypotension can cause light-headedness, sweating, rapid heartbeat, blurred vision, and fainting. These symptoms require further investigation into the cause. What are the causes? Orthostatic hypotension can be caused by many things, including: Sudden changes in posture, such as standing up quickly after you have been sitting or lying down. Loss of blood (anemia) or loss of body fluids (dehydration). Heart problems, neurologic problems, or hormone problems. Pregnancy. Aging. The risk for this condition increases as you get older. Severe infection (sepsis). Certain medicines, such as medicines for high blood pressure or medicines that make the body lose excess fluids (diuretics). What are the signs or symptoms? Symptoms of this condition may include: Weakness, light-headedness, or dizziness. Sweating. Blurred vision. Tiredness (fatigue). Rapid heartbeat. Fainting, in severe cases. How is this diagnosed? This condition is diagnosed based on: Your symptoms and medical history. Your blood pressure measurements. Your health care provider will check your blood pressure when you are: Lying down. Sitting. Standing. A blood pressure reading is recorded as two numbers, such as "120 over 80" (or 120/80). The first ("top") number is called the systolic pressure. It is a measure of the pressure in your arteries as your heart beats. The second ("bottom") number is called the diastolic pressure. It is a measure of  the pressure in your arteries when your heart relaxes between beats. Blood pressure is measured in a unit called mmHg. Healthy blood pressure for most adults is 120/80 mmHg. Orthostatic hypotension is defined as a 20 mmHg drop in systolic pressure or a 10 mmHg drop in diastolic pressure within 3 minutes of standing. Other information or tests that may be used to diagnose orthostatic hypotension include: Your other vital signs, such as your heart rate and temperature. Blood tests. An electrocardiogram (ECG) or echocardiogram. A Holter monitor. This is a device you wear that records your heart rhythm continuously, usually for 24-48 hours. Tilt table test. For this test, you will be safely secured to a table that moves you from a lying position to an upright position. Your heart rhythm and blood pressure will be monitored during the test. How is this treated? This condition may be treated by: Changing your diet. This may involve eating more salt (sodium) or drinking more water. Changing the dosage of certain medicines you are taking that might be lowering your blood pressure. Correcting the underlying reason for the orthostatic hypotension. Wearing compression stockings. Taking medicines to raise your blood pressure. Avoiding actions that trigger symptoms. Follow these instructions at home: Medicines Take over-the-counter and prescription medicines only as told by your health care provider. Follow instructions from your health care provider about changing the dosage of your current medicines, if this applies. Do not stop or adjust any of your medicines on your own. Eating and drinking  Drink enough fluid to keep your urine pale yellow. Eat extra salt only as directed. Do not add extra salt to your diet unless advised by your health care provider. Eat frequent, small meals. Avoid standing up suddenly after eating. General instructions    Get up slowly from lying down or sitting positions. This  gives your blood pressure a chance to adjust. Avoid hot showers and excessive heat as directed by your health care provider. Engage in regular physical activity as directed by your health care provider. If you have compression stockings, wear them as told. Keep all follow-up visits. This is important. Contact a health care provider if: You have a fever for more than 2-3 days. You feel more thirsty than usual. You feel dizzy or weak. Get help right away if: You have chest pain. You have a fast or irregular heartbeat. You become sweaty or feel light-headed. You feel short of breath. You faint. You have any symptoms of a stroke. "BE FAST" is an easy way to remember the main warning signs of a stroke: B - Balance. Signs are dizziness, sudden trouble walking, or loss of balance. E - Eyes. Signs are trouble seeing or a sudden change in vision. F - Face. Signs are sudden weakness or numbness of the face, or the face or eyelid drooping on one side. A - Arms. Signs are weakness or numbness in an arm. This happens suddenly and usually on one side of the body. S - Speech. Signs are sudden trouble speaking, slurred speech, or trouble understanding what people say. T - Time. Time to call emergency services. Write down what time symptoms started. You have other signs of a stroke, such as: A sudden, severe headache with no known cause. Nausea or vomiting. Seizure. These symptoms may represent a serious problem that is an emergency. Do not wait to see if the symptoms will go away. Get medical help right away. Call your local emergency services (911 in the [place] Do not drive yourself to the hospital. Summary Orthostatic hypotension is a sudden drop in blood pressure. It can cause light-headedness, sweating, rapid heartbeat, blurred vision, and fainting. Orthostatic hypotension can be diagnosed by having your blood pressure taken while lying down, sitting, and then standing. Treatment may involve  changing your diet, wearing compression stockings, sitting up slowly, adjusting your medicines, or correcting the underlying reason for the orthostatic hypotension. Get help right away if you have chest pain, a fast or irregular heartbeat, or symptoms of a stroke. This information is not intended to replace advice given to you by your health care provider. Make sure you discuss any questions you have with your health care provider. Document Revised: 03/29/2020 Document Reviewed: 03/29/2020 Elsevier Patient Education  2024 [employer]  

## 2022-10-02 NOTE — Assessment & Plan Note (Signed)
Increased migraine in response to orthostatic changes. It is not clear if the hypotension or rebound hypertension are causing the headache. We discussed the option of starting propranolol to help reduce the orthostatic changes and lower her BP at baseline to a more controlled level. Given that this medication does not have a strong propensity to blood pressure changes, I do not feel there is concern for dropping her pressures too low. We will start with dosing in the afternoon and evening and she may add the morning dose to help with blood pressure and orthostatic control if this is needed. The medication should aid in the reduction of rebound migraine headaches from these changes. We will also send ubrelvy by prescription for her to have on hand. She will follow-up if the symptoms do not improve with this treatment.

## 2022-10-02 NOTE — Assessment & Plan Note (Signed)
Elevation in BP, primarily noted in the afternoons. We discussed that wrist cuffs can lead to higher BP measurements, therefore the upper arm cuff is preferred, but the wrist cuff will be ok until she has this. Given the normal readings in the mornings, I am concerned with lowering her BP too much. I feel that utilization of a beta blocker, such as propranolol will help with both lowering the blood pressure and normalization of pressures with movement to help reduce orthostatic changes. In the event the orthostatic changes are triggering the migraines, I suspect this will be helpful, as well. In addition, this medication is known to help reduce migraine attacks. We will plan to start with twice a day dosing, in the afternoon and evening, but we discussed she can start the AM dosing if her BP appears to be elevated or she continues to have orthostatic changes at that time, as well. She will hold the medication if her BP is less than 110/60. We will see how she does on this.

## 2022-10-02 NOTE — Progress Notes (Signed)
Tollie Eth, DNP, AGNP-c [facility] [address] [place], [place] [address] [phone]   ACUTE VISIT- ESTABLISHED PATIENT  Blood pressure 132/78, pulse 61, weight 151 lb 3.2 oz (68.6 kg), last menstrual period 01/28/1984, SpO2 99%.  Subjective:  HPI Miranda Valentine is a 67 y.o. female presents to day for evaluation of acute concern(s).   HTN Kash tells me she had an exam with [facility] and her BP was elevated. She started taking her BP at home and has noted that it has been on the higher end, between 135-150 in the afternoons consistently. In the mornings she reports the readings are lower. She has been using a wrist cuff to monitor. She does have an upper arm cuff at home.   She has also noted some orthostatic dizziness upon standing with a headache/migraine onset shortly thereafter. She has utilized [place] for this in the past and this has been very helpful. She would like a refill on this today.   ROS negative except for what is listed in HPI. History, Medications, Surgery, SDOH, and Family History reviewed and updated as appropriate.  Objective:  Physical Exam Vitals and nursing note reviewed.  Constitutional:      General: She is not in acute distress.    Appearance: Normal appearance. She is normal weight. She is not ill-appearing.  HENT:     Head: Normocephalic.  Eyes:     Extraocular Movements: Extraocular movements intact.     Conjunctiva/sclera: Conjunctivae normal.     Pupils: Pupils are equal, round, and reactive to light.  Neck:     Vascular: No carotid bruit.  Cardiovascular:     Rate and Rhythm: Normal rate and regular rhythm.     Pulses: Normal pulses.     Heart sounds: Normal heart sounds.  Pulmonary:     Effort: Pulmonary effort is normal.     Breath sounds: Normal breath sounds.  Musculoskeletal:        General: Normal range of motion.     Right lower leg: No edema.     Left lower leg: No edema.  Skin:    General: Skin is warm  and dry.     Capillary Refill: Capillary refill takes less than 2 seconds.  Neurological:     General: No focal deficit present.     Mental Status: She is alert and oriented to person, place, and time.     Motor: No weakness.         Assessment & Plan:   Problem List Items Addressed This Visit     Migraine    Increased migraine in response to orthostatic changes. It is not clear if the hypotension or rebound hypertension are causing the headache. We discussed the option of starting propranolol to help reduce the orthostatic changes and lower her BP at baseline to a more controlled level. Given that this medication does not have a strong propensity to blood pressure changes, I do not feel there is concern for dropping her pressures too low. We will start with dosing in the afternoon and evening and she may add the morning dose to help with blood pressure and orthostatic control if this is needed. The medication should aid in the reduction of rebound migraine headaches from these changes. We will also send ubrelvy by prescription for her to have on hand. She will follow-up if the symptoms do not improve with this treatment.       Relevant Medications   propranolol (INDERAL) 10 MG  tablet   Ubrogepant (UBRELVY) 100 MG TABS   Other Relevant Orders   Comprehensive metabolic panel   Hypertension - Primary    Elevation in BP, primarily noted in the afternoons. We discussed that wrist cuffs can lead to higher BP measurements, therefore the upper arm cuff is preferred, but the wrist cuff will be ok until she has this. Given the normal readings in the mornings, I am concerned with lowering her BP too much. I feel that utilization of a beta blocker, such as propranolol will help with both lowering the blood pressure and normalization of pressures with movement to help reduce orthostatic changes. In the event the orthostatic changes are triggering the migraines, I suspect this will be helpful, as well. In  addition, this medication is known to help reduce migraine attacks. We will plan to start with twice a day dosing, in the afternoon and evening, but we discussed she can start the AM dosing if her BP appears to be elevated or she continues to have orthostatic changes at that time, as well. She will hold the medication if her BP is less than 110/60. We will see how she does on this.       Relevant Medications   propranolol (INDERAL) 10 MG tablet   Other Relevant Orders   Comprehensive metabolic panel     Tollie Eth, DNP, AGNP-c Time: 33 minutes, >50% spent counseling, care coordination, chart review, and documentation.

## 2022-10-03 LAB — COMPREHENSIVE METABOLIC PANEL
ALT: 13 IU/L (ref 0–32)
AST: 14 IU/L (ref 0–40)
Albumin: 4.5 g/dL (ref 3.9–4.9)
Alkaline Phosphatase: 112 IU/L (ref 44–121)
BUN/Creatinine Ratio: 21 (ref 12–28)
BUN: 14 mg/dL (ref 8–27)
Bilirubin Total: 0.2 mg/dL (ref 0.0–1.2)
CO2: 24 mmol/L (ref 20–29)
Calcium: 9.4 mg/dL (ref 8.7–10.3)
Chloride: 99 mmol/L (ref 96–106)
Creatinine, Ser: 0.68 mg/dL (ref 0.57–1.00)
Globulin, Total: 2.6 g/dL (ref 1.5–4.5)
Glucose: 92 mg/dL (ref 70–99)
Potassium: 4.4 mmol/L (ref 3.5–5.2)
Sodium: 137 mmol/L (ref 134–144)
Total Protein: 7.1 g/dL (ref 6.0–8.5)
eGFR: 95 mL/min/{1.73_m2} (ref >59)

## 2022-10-21 ENCOUNTER — Telehealth: Payer: Self-pay

## 2022-10-21 NOTE — Telephone Encounter (Signed)
Refill request faxed for ubrelvy 100mg . Lats appt. 06/25/22. Next appt. 11/19/22

## 2022-10-30 ENCOUNTER — Encounter: Payer: Self-pay | Admitting: Nurse Practitioner

## 2022-10-30 ENCOUNTER — Telehealth (INDEPENDENT_AMBULATORY_CARE_PROVIDER_SITE_OTHER): Payer: Managed Care, Other (non HMO) | Admitting: Nurse Practitioner

## 2022-10-30 VITALS — BP 149/75 | Wt 148.0 lb

## 2022-10-30 DIAGNOSIS — I10 Essential (primary) hypertension: Secondary | ICD-10-CM

## 2022-11-04 NOTE — Progress Notes (Signed)
Patient requests additional time to monitor BP and consistently take medication. We will reschedule this for a time when she has had a chance to do this.

## 2022-11-06 ENCOUNTER — Other Ambulatory Visit: Payer: Self-pay | Admitting: Obstetrics & Gynecology

## 2022-11-06 DIAGNOSIS — Z0000 Encounter for general adult medical examination without abnormal findings: Secondary | ICD-10-CM

## 2022-11-10 ENCOUNTER — Telehealth: Payer: Self-pay | Admitting: Nurse Practitioner

## 2022-11-10 DIAGNOSIS — G43709 Chronic migraine without aura, not intractable, without status migrainosus: Secondary | ICD-10-CM

## 2022-11-10 NOTE — Telephone Encounter (Signed)
KeyRaquel James) PA Case ID #: [id number] Need Help? Call us at [phone] Status sent iconSent to Plan today Next Steps The plan will fax you a determination, typically within 1 to 5 business days. Drug [name] 100MG  tablets ePA cloud logo Form [facility] Electronic Prior Authorization Form [phone] NCPDP)

## 2022-11-13 ENCOUNTER — Telehealth (INDEPENDENT_AMBULATORY_CARE_PROVIDER_SITE_OTHER): Payer: Managed Care, Other (non HMO) | Admitting: Nurse Practitioner

## 2022-11-13 ENCOUNTER — Encounter: Payer: Self-pay | Admitting: Nurse Practitioner

## 2022-11-13 VITALS — BP 133/79 | HR 62 | Wt 147.0 lb

## 2022-11-13 DIAGNOSIS — G43909 Migraine, unspecified, not intractable, without status migrainosus: Secondary | ICD-10-CM

## 2022-11-13 DIAGNOSIS — K219 Gastro-esophageal reflux disease without esophagitis: Secondary | ICD-10-CM | POA: Diagnosis not present

## 2022-11-13 DIAGNOSIS — I10 Essential (primary) hypertension: Secondary | ICD-10-CM

## 2022-11-13 DIAGNOSIS — F32A Depression, unspecified: Secondary | ICD-10-CM

## 2022-11-13 DIAGNOSIS — J324 Chronic pansinusitis: Secondary | ICD-10-CM | POA: Diagnosis not present

## 2022-11-13 DIAGNOSIS — J3089 Other allergic rhinitis: Secondary | ICD-10-CM | POA: Diagnosis not present

## 2022-11-13 MED ORDER — PRISTIQ 100 MG PO TB24
100.0000 mg | ORAL_TABLET | Freq: Every morning | ORAL | 3 refills | Status: DC
Start: 2022-11-13 — End: 2022-11-27

## 2022-11-13 MED ORDER — OMEPRAZOLE 10 MG PO CPDR
10.0000 mg | DELAYED_RELEASE_CAPSULE | Freq: Every day | ORAL | 3 refills | Status: DC
Start: 2022-11-13 — End: 2023-11-17

## 2022-11-13 MED ORDER — MONTELUKAST SODIUM 10 MG PO TABS
10.0000 mg | ORAL_TABLET | Freq: Every day | ORAL | 3 refills | Status: DC
Start: 2022-11-13 — End: 2023-10-13

## 2022-11-13 NOTE — Progress Notes (Signed)
Virtual Visit Encounter mychart visit.   I connected with  Miranda Valentine on 11/24/22 at 12:00 PM EDT by secure video and audio telemedicine application. I verified that I am speaking with the correct person using two identifiers.   I introduced myself as a Publishing rights manager with the practice. The limitations of evaluation and management by telemedicine discussed with the patient and the availability of in person appointments. The patient expressed verbal understanding and consent to proceed.  Participating parties in this visit include: Myself and patient  The patient is: Patient Location: Home I am: Provider Location: Office/Clinic Subjective:    CC and HPI: Miranda Valentine is a 67 y.o. year old female presenting for follow up of hypertension.  Miranda Valentine has been monitoring her blood pressure at home. She reports that her readings have been variable, with a morning reading of 133/79 after coffee intake, and an evening reading of 115/73, which she considers ideal. She expresses concern about the higher pressure in the morning.  She has been prescribed propranolol TID dosing for her hypertension, but she admits to struggling with the midday dose due to her daily routine. She has been considering reducing the medication to twice daily, in the morning and at night, to align with her routine.  In addition to hypertension, the patient also has a history of migraines. She is awaiting Therapist, occupational for Miranda Valentine, but she has not had any recent migraines and still has a sample dose available.   She also has allergies and is currently seeing an allergist. She is on montelukast and cetirizine (Zyrtec), which she purchases over the counter. She expresses a desire to consolidate her care and have her allergy medications managed by me.   Past medical history, Surgical history, Family history not pertinant except as noted below, Social history, Allergies, and medications have been entered into the medical  record, reviewed, and corrections made.   Review of Systems:  All review of systems negative except what is listed in the HPI  Objective:    Alert and oriented x 4 Speaking in clear sentences with no shortness of breath. No distress.  Impression and Recommendations:    Problem List Items Addressed This Visit     Migraine    No recent episodes. Pending insurance approval for Miranda Valentine. -Continue to monitor for migraines. -If needed, provide more samples of Ubrelvy until insurance approval is obtained.      Relevant Medications   PRISTIQ 100 MG 24 hr tablet   Gastroesophageal reflux disease    Omeprazole refill requested by patient. -Refill Omeprazole prescription.      Relevant Medications   omeprazole (PRILOSEC) 10 MG capsule   Depression    Pristiq may run out soon. Symptoms well controlled on current regimen with no alarm symptoms.  -Refill Pristiq prescription.       Relevant Medications   PRISTIQ 100 MG 24 hr tablet   Hypertension    Blood pressure readings vary, possibly influenced by caffeine intake. Difficulty adhering to midday dose of Propranolol. -Continue Propranolol in the morning and at night, omitting the midday dose. -Monitor blood pressure and communicate any concerns via MyChart. -Consider long acting formula for once daily dosing if this is ineffective.       Environmental and seasonal allergies    Patient wishes to consolidate care. -Refill Montelukast prescription.      Relevant Medications   montelukast (SINGULAIR) 10 MG tablet   Chronic pansinusitis - Primary   Relevant Medications   montelukast (  SINGULAIR) 10 MG tablet    current treatment plan is effective, no change in therapy, orders and follow up as documented in EMR I discussed the assessment and treatment plan with the patient. The patient was provided an opportunity to ask questions and all were answered. The patient agreed with the plan and demonstrated an understanding of the  instructions.   The patient was advised to call back or seek an in-person evaluation if the symptoms worsen or if the condition fails to improve as anticipated.  Follow-Up: in 3 months  I provided 18 minutes of non-face-to-face interaction with this non face-to-face encounter including intake, same-day documentation, and chart review.   Tollie Eth, NP , DNP, AGNP-c [facility]

## 2022-11-14 NOTE — Telephone Encounter (Signed)
P.A. denied, pt needs trial & failure of 2 Triptans.  The only medication I see she has tried is Topiramate.  Do you want to switch to a Triptan?

## 2022-11-19 MED ORDER — RIZATRIPTAN BENZOATE 10 MG PO TABS
10.0000 mg | ORAL_TABLET | ORAL | 3 refills | Status: AC | PRN
Start: 2022-11-19 — End: ?

## 2022-11-19 NOTE — Telephone Encounter (Signed)
I have sent rizatriptan for her to the pharmacy. Do you mind letting her know, please?  Thank you!

## 2022-11-20 NOTE — Telephone Encounter (Signed)
Pt informed

## 2022-11-24 DIAGNOSIS — J3089 Other allergic rhinitis: Secondary | ICD-10-CM | POA: Insufficient documentation

## 2022-11-24 NOTE — Assessment & Plan Note (Signed)
Pristiq may run out soon. Symptoms well controlled on current regimen with no alarm symptoms.  -Refill Pristiq prescription.

## 2022-11-24 NOTE — Assessment & Plan Note (Signed)
Patient wishes to consolidate care. -Refill Montelukast prescription.

## 2022-11-24 NOTE — Assessment & Plan Note (Signed)
No recent episodes. Pending insurance approval for [name]. -Continue to monitor for migraines. -If needed, provide more samples of Ubrelvy until insurance approval is obtained.

## 2022-11-24 NOTE — Assessment & Plan Note (Signed)
Omeprazole refill requested by patient. -Refill Omeprazole prescription.

## 2022-11-24 NOTE — Assessment & Plan Note (Addendum)
Blood pressure readings vary, possibly influenced by caffeine intake. Difficulty adhering to midday dose of Propranolol. -Continue Propranolol in the morning and at night, omitting the midday dose. -Monitor blood pressure and communicate any concerns via MyChart. -Consider long acting formula for once daily dosing if this is ineffective.

## 2022-11-26 ENCOUNTER — Telehealth: Payer: Self-pay | Admitting: Family Medicine

## 2022-11-26 ENCOUNTER — Encounter: Payer: Self-pay | Admitting: Nurse Practitioner

## 2022-11-26 NOTE — Telephone Encounter (Signed)
[facility] faxed req for alternative to Pristiq.   Preferred drugs are Desvenlafaxtabmger, venlafaxinetabmger, duloextinecpa mg, Fetzimacap ms, bupropn HCL tab  Please advise pharmacy which drug

## 2022-11-27 ENCOUNTER — Other Ambulatory Visit: Payer: Self-pay

## 2022-11-27 DIAGNOSIS — F32A Depression, unspecified: Secondary | ICD-10-CM

## 2022-11-27 MED ORDER — PRISTIQ 100 MG PO TB24: 100.0000 mg | ORAL_TABLET | Freq: Every morning | ORAL | 3 refills | Status: DC

## 2023-03-22 ENCOUNTER — Other Ambulatory Visit (HOSPITAL_BASED_OUTPATIENT_CLINIC_OR_DEPARTMENT_OTHER): Payer: Self-pay | Admitting: Obstetrics & Gynecology

## 2023-03-22 ENCOUNTER — Other Ambulatory Visit: Payer: Self-pay | Admitting: Nurse Practitioner

## 2023-03-22 DIAGNOSIS — G43809 Other migraine, not intractable, without status migrainosus: Secondary | ICD-10-CM

## 2023-03-22 DIAGNOSIS — Z79890 Hormone replacement therapy (postmenopausal): Secondary | ICD-10-CM

## 2023-03-22 DIAGNOSIS — I10 Essential (primary) hypertension: Secondary | ICD-10-CM

## 2023-04-13 ENCOUNTER — Ambulatory Visit
Admission: RE | Admit: 2023-04-13 | Discharge: 2023-04-13 | Disposition: A | Discharge status: Home / Self Care | Payer: Managed Care, Other (non HMO) | Source: Ambulatory Visit | Attending: Obstetrics & Gynecology | Admitting: Obstetrics & Gynecology

## 2023-04-13 DIAGNOSIS — Z0000 Encounter for general adult medical examination without abnormal findings: Secondary | ICD-10-CM

## 2023-05-22 ENCOUNTER — Ambulatory Visit: Payer: Self-pay

## 2023-05-22 NOTE — Telephone Encounter (Signed)
 Copied from CRM [id number]. Topic: Clinical - Medical Advice >> May 22, 2023  8:10 AM Leory Rands wrote: Reason for CRM: Patient is calling to report pressure in her head and congestion. Her practice has no available appointments until next week. Please advise.  Patient decided to go to the Urgent Care advice given, denies questions; instructed to go to ER if becomes worse., advice given, denies questions; instructed to go to ER if becomes worse.Aaron Aas

## 2023-06-22 ENCOUNTER — Other Ambulatory Visit: Payer: Self-pay | Admitting: Nurse Practitioner

## 2023-06-22 DIAGNOSIS — F5101 Primary insomnia: Secondary | ICD-10-CM

## 2023-06-24 ENCOUNTER — Encounter: Payer: Self-pay | Admitting: Nurse Practitioner

## 2023-06-24 DIAGNOSIS — F5101 Primary insomnia: Secondary | ICD-10-CM

## 2023-06-24 MED ORDER — ZALEPLON 10 MG PO CAPS: 10.0000 mg | ORAL_CAPSULE | Freq: Every evening | ORAL | 1 refills | Status: DC | PRN

## 2023-07-03 ENCOUNTER — Ambulatory Visit (HOSPITAL_BASED_OUTPATIENT_CLINIC_OR_DEPARTMENT_OTHER): Payer: Managed Care, Other (non HMO) | Admitting: Obstetrics & Gynecology

## 2023-07-24 ENCOUNTER — Other Ambulatory Visit (HOSPITAL_BASED_OUTPATIENT_CLINIC_OR_DEPARTMENT_OTHER): Payer: Self-pay | Admitting: *Deleted

## 2023-07-24 DIAGNOSIS — M85852 Other specified disorders of bone density and structure, left thigh: Secondary | ICD-10-CM

## 2023-07-24 MED ORDER — VITAMIN D (ERGOCALCIFEROL) 1.25 MG (50000 UNIT) PO CAPS: 50000.0000 [IU] | ORAL_CAPSULE | ORAL | 0 refills | Status: DC

## 2023-07-24 NOTE — Telephone Encounter (Signed)
 Received fax for refill request.  Pt has annual scheduled with Dr Cleotilde Sept 2025. KD CMA

## 2023-09-29 ENCOUNTER — Ambulatory Visit (HOSPITAL_BASED_OUTPATIENT_CLINIC_OR_DEPARTMENT_OTHER): Admitting: Obstetrics & Gynecology

## 2023-10-12 NOTE — Progress Notes (Unsigned)
 Annual Exam Patient name: Miranda Valentine MRN [id number]  Date of birth: 1955/07/31 Chief Complaint:   Annual Exam  History of Present Illness:   Miranda Valentine is a 68 y.o. G1P1 Caucasian female being seen today for a routine annual exam.  Denies vaginal bleeding.  Did labs last month.  Her husband works at [employer].  Is still on 0.5mg     Patient's last menstrual period was 01/28/1984.   Last pap 07/18/2016. Results were: NILM w/ HRHPV not done. H/O abnormal pap: no Last mammogram: 04/13/2023. Results were: normal. Family h/o breast cancer: no Last colonoscopy: 06/2023. Results were: abnormal polyp. Family h/o colorectal cancer: no.  Follow up 5 years.   Dexa:  2022 with osteopenia      10/13/2023   10:25 AM 06/20/2022   10:16 AM 03/17/2022    9:39 AM 08/20/2021    9:36 AM 06/07/2021    9:21 AM  Depression screen PHQ 2/9  Decreased Interest 0 0 0 0 0  Down, Depressed, Hopeless 0 0 0 0 0  PHQ - 2 Score 0 0 0 0 0  Altered sleeping    0   Tired, decreased energy    0   Change in appetite    0   Feeling bad or failure about yourself     0   Trouble concentrating    0   Moving slowly or fidgety/restless    0   Suicidal thoughts    0   PHQ-9 Score    0   Difficult doing work/chores    Not difficult at all       Review of Systems:   Pertinent items are noted in HPI Denies any bowel or bladder changes.  Denies pelvic pain.   Pertinent History Reviewed:  Reviewed past medical,surgical, social and family history.  Reviewed problem list, medications and allergies. Physical Assessment:   Vitals:   10/13/23 1020  BP: 137/75  Pulse: 82  SpO2: 98%  Weight: 154 lb 12.8 oz (70.2 kg)  Height: 5' 3 (1.6 m)  Body mass index is 27.42 kg/m.        Physical Examination:   General appearance - well appearing, and in no distress  Mental status - alert, oriented to person, place, and time  Psych:  She has a normal mood and affect  Skin - warm and dry, normal color, no suspicious  lesions noted  Chest - effort normal, all lung fields clear to auscultation bilaterally  Heart - normal rate and regular rhythm  Neck:  midline trachea, no thyromegaly or nodules  Breasts - breasts appear normal, no suspicious masses, no skin or nipple changes or  axillary nodes  Abdomen - soft, nontender, nondistended, no masses or organomegaly  Pelvic - VULVA: normal appearing vulva with no masses, tenderness or lesions   VAGINA: normal appearing vagina with normal color and discharge, no lesions   CERVIX: surgically absent  Thin prep pap is not indicated  UTERUS: surgically absent  ADNEXA: No adnexal masses or tenderness noted.  Rectal - normal rectal, good sphincter tone, no masses felt.   Extremities:  No swelling or varicosities noted  Chaperone present for exam  No results found for this or any previous visit (from the past 24 hours).  Assessment & Plan:  1. Well woman exam with routine gynecological exam (Primary) ***  2. Osteopenia of neck of left femur *** - DG Bone Density; Future - Vitamin D , Ergocalciferol , (DRISDOL ) 1.25 MG (50000 UNIT)  CAPS capsule; Take 1 capsule (50,000 Units total) by mouth every 14 (fourteen) days.  Dispense: 6 capsule; Refill: 1  3. Postmenopausal HRT (hormone replacement therapy) *** - estradiol  (ESTRACE ) 0.5 MG tablet; Take 1 tablet (0.5 mg total) by mouth daily.  Dispense: 90 tablet; Refill: 3  4. Primary hypertension ***  5. S/P TAH-BSO ***  Meds: No orders of the defined types were placed in this encounter.   Follow-up: No follow-ups on file.  Sprague, Kaitlyn E, RN 10/13/2023 10:27 AM

## 2023-10-13 ENCOUNTER — Encounter (HOSPITAL_BASED_OUTPATIENT_CLINIC_OR_DEPARTMENT_OTHER): Payer: Self-pay | Admitting: Obstetrics & Gynecology

## 2023-10-13 ENCOUNTER — Ambulatory Visit (INDEPENDENT_AMBULATORY_CARE_PROVIDER_SITE_OTHER): Admitting: Obstetrics & Gynecology

## 2023-10-13 VITALS — BP 137/75 | HR 82 | Ht 63.0 in | Wt 154.8 lb

## 2023-10-13 DIAGNOSIS — Z90710 Acquired absence of both cervix and uterus: Secondary | ICD-10-CM

## 2023-10-13 DIAGNOSIS — Z79890 Hormone replacement therapy (postmenopausal): Secondary | ICD-10-CM

## 2023-10-13 DIAGNOSIS — M85852 Other specified disorders of bone density and structure, left thigh: Secondary | ICD-10-CM | POA: Diagnosis not present

## 2023-10-13 DIAGNOSIS — I10 Essential (primary) hypertension: Secondary | ICD-10-CM

## 2023-10-13 DIAGNOSIS — Z90722 Acquired absence of ovaries, bilateral: Secondary | ICD-10-CM

## 2023-10-13 DIAGNOSIS — Z1331 Encounter for screening for depression: Secondary | ICD-10-CM

## 2023-10-13 DIAGNOSIS — Z01419 Encounter for gynecological examination (general) (routine) without abnormal findings: Secondary | ICD-10-CM | POA: Diagnosis not present

## 2023-10-13 DIAGNOSIS — Z9079 Acquired absence of other genital organ(s): Secondary | ICD-10-CM

## 2023-10-13 MED ORDER — VITAMIN D (ERGOCALCIFEROL) 1.25 MG (50000 UNIT) PO CAPS: 50000.0000 [IU] | ORAL_CAPSULE | ORAL | 1 refills | Status: AC

## 2023-10-13 MED ORDER — ESTRADIOL 0.5 MG PO TABS: 0.5000 mg | ORAL_TABLET | Freq: Every day | ORAL | 3 refills | Status: AC

## 2023-10-14 ENCOUNTER — Encounter (HOSPITAL_BASED_OUTPATIENT_CLINIC_OR_DEPARTMENT_OTHER): Payer: Self-pay | Admitting: Obstetrics & Gynecology

## 2023-11-17 ENCOUNTER — Other Ambulatory Visit: Payer: Self-pay | Admitting: Nurse Practitioner

## 2023-11-17 DIAGNOSIS — K219 Gastro-esophageal reflux disease without esophagitis: Secondary | ICD-10-CM

## 2023-12-06 ENCOUNTER — Other Ambulatory Visit: Payer: Self-pay | Admitting: Nurse Practitioner

## 2023-12-06 DIAGNOSIS — F32A Depression, unspecified: Secondary | ICD-10-CM

## 2023-12-17 ENCOUNTER — Other Ambulatory Visit: Payer: Self-pay | Admitting: Nurse Practitioner

## 2023-12-17 DIAGNOSIS — K219 Gastro-esophageal reflux disease without esophagitis: Secondary | ICD-10-CM

## 2023-12-19 ENCOUNTER — Other Ambulatory Visit: Payer: Self-pay | Admitting: Nurse Practitioner

## 2023-12-19 DIAGNOSIS — G43809 Other migraine, not intractable, without status migrainosus: Secondary | ICD-10-CM

## 2023-12-19 DIAGNOSIS — I10 Essential (primary) hypertension: Secondary | ICD-10-CM

## 2024-02-05 NOTE — Progress Notes (Unsigned)
 Influenza Vaccine {SEHMDOC:34218}  Catheline Doing, DNP, AGNP-c [facility] [address] [place], [place] [address] Main Office [phone] VISIT TYPE: CPE on 02/08/2024 There were no vitals filed for this visit. There is no height or weight on file to calculate BMI.  Wt Readings from Last 3 Encounters:  10/13/23 154 lb 12.8 oz (70.2 kg)  11/13/22 147 lb (66.7 kg)  10/30/22 148 lb (67.1 kg)     Subjective:  No chief complaint on file.   *** {Miranda Valentine; complete female:[id number]::Pertinent items are noted in HPI.}     10/13/2023   10:25 AM 06/20/2022   10:16 AM 03/17/2022    9:39 AM 08/20/2021    9:36 AM 06/07/2021    9:21 AM  Depression screen PHQ 2/9  Decreased Interest 0 0 0 0 0  Down, Depressed, Hopeless 0 0 0 0 0  PHQ - 2 Score 0 0 0 0 0  Altered sleeping    0   Tired, decreased energy    0   Change in appetite    0   Feeling bad or failure about yourself     0   Trouble concentrating    0   Moving slowly or fidgety/restless    0   Suicidal thoughts    0   PHQ-9 Score    0    Difficult doing work/chores    Not difficult at all      Data saved with a previous flowsheet row definition        No data to display             10/13/2023   10:26 AM 03/17/2022    9:39 AM 10/01/2021   10:21 AM 08/20/2021    9:36 AM 03/15/2021   11:24 AM  Fall Risk   Falls in the past year? 0 0 1 0 0  Number falls in past yr: 0 0 0 0 0  Injury with Fall? 0  0  0  0  0   Risk for fall due to : No Fall Risks No Fall Risks No Fall Risks No Fall Risks No Fall Risks  Follow up  Falls evaluation completed Falls evaluation completed  Falls evaluation completed  Falls evaluation completed      Data saved with a previous flowsheet row definition   Past medical history, surgical history, medications, allergies, family history and social history reviewed with patient today and changes made to appropriate areas of the chart.      Objective:    Physical Exam      Assessment &  Plan:   Assessment & Plan  NEXT PREVENTATIVE PHYSICAL DUE IN 1 YEAR.    PATIENT COUNSELING PROVIDED FOR ALL ADULT PATIENTS: A well balanced diet low in saturated fats, cholesterol, and moderation in carbohydrates.  This can be as simple as monitoring portion sizes and cutting back on sugary beverages such as soda and juice to start with.    Daily water consumption of at least 64 ounces.  Physical activity at least 180 minutes per week.  If just starting out, start 10 minutes a day and work your way up.   This can be as simple as taking the stairs instead of the elevator and walking 2-3 laps around the office  purposefully every day.   STD protection, partner selection, and regular testing if high risk.  Limited consumption of alcoholic beverages if alcohol is consumed. For men, I recommend no more than 14 alcoholic beverages per week,  spread out throughout the week (max 2 per day). Avoid binge drinking or consuming large quantities of alcohol in one setting.  Please let me know if you feel you may need help with reduction or quitting alcohol consumption.   Avoidance of nicotine, if used. Please let me know if you feel you may need help with reduction or quitting nicotine use.   Daily mental health attention. This can be in the form of 5 minute daily meditation, prayer, journaling, yoga, reflection, etc.  Purposeful attention to your emotions and mental state can significantly improve your overall wellbeing  and Health.  Please know that I am here to help you with all of your health care goals and am happy to work with you to find a solution that works best for you.  The greatest advice I have received with any changes in life are to take it one step at a time, that even means if all you can focus on is the next 60 seconds, then do that and celebrate your victories.  With any changes in life, you will have set backs, and that is OK. The important thing to remember is, if you have a  set back, it is not a failure, it is an opportunity to try again! Screening Testing Mammogram Every 1 -2 years based on history and risk factors Starting at age 42 Pap Smear Ages 21-39 every 3 years Ages 80-65 every 5 years with HPV testing More frequent testing may be required based on results and history Colon Cancer Screening Every 1-10 years based on test performed, risk factors, and history Starting at age 15 Bone Density Screening Every 2-10 years based on history Starting at age 81 for women Recommendations for men differ based on medication usage, history, and risk factors AAA Screening One time ultrasound Men 50-54 years old who have every smoked Lung Cancer Screening Low Dose Lung CT every 12 months Age 79-80 years with a 30 pack-year smoking history who still smoke or who have quit within the last 15 years   Screening Labs Routine  Labs: Complete Blood Count (CBC), Complete Metabolic Panel (CMP), Cholesterol (Lipid Panel) Every 6-12 months based on history and medications May be recommended more frequently based on current conditions or previous results Hemoglobin A1c Lab Every 3-12 months based on history and previous results Starting at age 38 or earlier with diagnosis of diabetes, high cholesterol, BMI >26, and/or risk factors Frequent monitoring for patients with diabetes to ensure blood sugar control Thyroid Panel (TSH) Every 6 months based on history, symptoms, and risk factors May be repeated more often if on medication HIV One time testing for all patients 42 and older May be repeated more frequently for patients with increased risk factors or exposure Hepatitis C One time testing for all patients 29 and older May be repeated more frequently for patients with increased risk factors or exposure Gonorrhea, Chlamydia Every 12 months for all sexually active persons 13-24 years Additional monitoring may be recommended for those who are considered high risk or  who have symptoms Every 12 months for any woman on birth control, regardless of sexual activity PSA Men 52-3 years old with risk factors Additional screening may be recommended from age 77-69 based on risk factors, symptoms, and history  Vaccine Recommendations Tetanus Booster All adults every 10 years Flu Vaccine All patients 6 months and older every year COVID Vaccine All patients 12 years and older Initial dosing with booster May recommend additional booster based on age  and health history HPV Vaccine 2 doses all patients age 73-26 Dosing may be considered for patients over 26 Shingles Vaccine (Shingrix) 2 doses all adults 55 years and older Pneumonia (Pneumovax 23) All adults 65 years and older May recommend earlier dosing based on health history One year apart from Prevnar 13 Pneumonia (Prevnar 27) All adults 65 years and older Dosed 1 year after Pneumovax 23 Pneumonia (Prevnar 20) One time alternative to the two dosing of 13 and 23 For all adults with initial dose of 23, 20 is recommended 1 year later For all adults with initial dose of 13, 23 is still recommended as second option 1 year later

## 2024-02-08 ENCOUNTER — Ambulatory Visit: Payer: Self-pay | Admitting: Nurse Practitioner

## 2024-02-08 ENCOUNTER — Encounter: Payer: Self-pay | Admitting: Nurse Practitioner

## 2024-02-08 VITALS — BP 120/74 | HR 62 | Ht 61.5 in | Wt 157.8 lb

## 2024-02-08 DIAGNOSIS — Z0000 Encounter for general adult medical examination without abnormal findings: Secondary | ICD-10-CM | POA: Diagnosis not present

## 2024-02-08 DIAGNOSIS — F32A Depression, unspecified: Secondary | ICD-10-CM

## 2024-02-08 DIAGNOSIS — M19041 Primary osteoarthritis, right hand: Secondary | ICD-10-CM

## 2024-02-08 DIAGNOSIS — F5101 Primary insomnia: Secondary | ICD-10-CM | POA: Diagnosis not present

## 2024-02-08 DIAGNOSIS — L909 Atrophic disorder of skin, unspecified: Secondary | ICD-10-CM

## 2024-02-08 DIAGNOSIS — M85852 Other specified disorders of bone density and structure, left thigh: Secondary | ICD-10-CM | POA: Diagnosis not present

## 2024-02-08 DIAGNOSIS — M19042 Primary osteoarthritis, left hand: Secondary | ICD-10-CM

## 2024-02-08 DIAGNOSIS — E7800 Pure hypercholesterolemia, unspecified: Secondary | ICD-10-CM

## 2024-02-08 DIAGNOSIS — G43809 Other migraine, not intractable, without status migrainosus: Secondary | ICD-10-CM

## 2024-02-08 DIAGNOSIS — I10 Essential (primary) hypertension: Secondary | ICD-10-CM

## 2024-02-08 DIAGNOSIS — K219 Gastro-esophageal reflux disease without esophagitis: Secondary | ICD-10-CM | POA: Diagnosis not present

## 2024-02-08 MED ORDER — OMEPRAZOLE 10 MG PO CPDR: 10.0000 mg | DELAYED_RELEASE_CAPSULE | Freq: Every day | ORAL | 3 refills | Status: AC

## 2024-02-08 MED ORDER — ZALEPLON 10 MG PO CAPS: 10.0000 mg | ORAL_CAPSULE | Freq: Every evening | ORAL | 1 refills | Status: AC | PRN

## 2024-02-08 MED ORDER — DESVENLAFAXINE SUCCINATE ER 100 MG PO TB24: 100.0000 mg | ORAL_TABLET | Freq: Every morning | ORAL | 3 refills | Status: AC

## 2024-02-08 MED ORDER — PROPRANOLOL HCL 10 MG PO TABS: 10.0000 mg | ORAL_TABLET | Freq: Three times a day (TID) | ORAL | 3 refills | Status: AC

## 2024-02-08 NOTE — Assessment & Plan Note (Addendum)
 Bone density screening ordered by Dr. Cleotilde. She plans to schedule it next year with her mammogram. - Proceed with bone density screening as planned. Will monitor vitamin D  levels today to ensure proper replacement.  Orders:   VITAMIN D  25 Hydroxy (Vit-D Deficiency, Fractures)

## 2024-02-08 NOTE — Assessment & Plan Note (Addendum)
 Total cholesterol previously over 200, but HDL is high, which may counteract the risk. Cardiac calcium score is zero, indicating low risk. Joint decision making utilized to avoid statin therapy at this time. Will monitor labs today. Diet and exercise to low cholesterol recommended.  - Continue current management and monitor cholesterol levels. Orders:   CBC with Differential/Platelet   CMP14+EGFR   Lipid panel   VITAMIN D  25 Hydroxy (Vit-D Deficiency, Fractures)

## 2024-02-08 NOTE — Assessment & Plan Note (Signed)
 Currently managed with omeprazole  10 mg daily. Discussed potential interference with calcium absorption. OK to try to taper off of this if she would like. Current dose is very low.  - Continue omeprazole  10 mg daily. - Consider trial off omeprazole  to assess symptom control.  Orders:   omeprazole  (PRILOSEC) 10 MG capsule; Take 1 capsule (10 mg total) by mouth daily.

## 2024-02-08 NOTE — Assessment & Plan Note (Addendum)

## 2024-02-08 NOTE — Assessment & Plan Note (Signed)
 Rizatriptan  is effective for migraine management. - Continue rizatriptan  as needed for migraine relief. Orders:   propranolol  (INDERAL ) 10 MG tablet; Take 1 tablet (10 mg total) by mouth 3 (three) times daily.

## 2024-02-08 NOTE — Assessment & Plan Note (Signed)
 Mood is well-managed with Pristiq . Occasional anxiety noted, likely related to external stressors. She has learned to reduce the input of external stressors.  - Continue Pristiq  as prescribed.  Orders:   desvenlafaxine  (PRISTIQ ) 100 MG 24 hr tablet; Take 1 tablet (100 mg total) by mouth every morning.

## 2024-02-08 NOTE — Assessment & Plan Note (Addendum)
 Blood pressure is well-controlled. No concerns present at this time. Continue with diet and exercise management for blood pressure.  Orders:   CBC with Differential/Platelet   CMP14+EGFR   Lipid panel   VITAMIN D  25 Hydroxy (Vit-D Deficiency, Fractures)   propranolol  (INDERAL ) 10 MG tablet; Take 1 tablet (10 mg total) by mouth 3 (three) times daily.

## 2024-02-08 NOTE — Patient Instructions (Addendum)
 You are looking fabulous today!!   Look for a pad to go on the bottom of your foot to help with loss of the fat pad. These are available on [employer] and at some pharmacies. If this is not helpful with the foot pain ,please let me know.   For all adult patients, I recommend A well balanced diet low in saturated fats, cholesterol, and moderation in carbohydrates.   This can be as simple as monitoring portion sizes and cutting back on sugary beverages such as soda and juice to start with.    Daily water consumption of at least 64 ounces.  Physical activity at least 180 minutes per week, if just starting out.   This can be as simple as taking the stairs instead of the elevator and walking 2-3 laps around the office  purposefully every day.   STD protection, partner selection, and regular testing if high risk.  Limited consumption of alcoholic beverages if alcohol is consumed.  For women, I recommend no more than 7 alcoholic beverages per week, spread out throughout the week.  Avoid binge drinking or consuming large quantities of alcohol in one setting.   Please let me know if you feel you may need help with reduction or quitting alcohol consumption.   Avoidance of nicotine, if used.  Please let me know if you feel you may need help with reduction or quitting nicotine use.   Daily mental health attention.  This can be in the form of 5 minute daily meditation, prayer, journaling, yoga, reflection, etc.   Purposeful attention to your emotions and mental state can significantly improve your overall wellbeing  and  Health.  Please know that I am here to help you with all of your health care goals and am happy to work with you to find a solution that works best for you.  The greatest advice I have received with any changes in life are to take it one step at a time, that even means if all you can focus on is the next 60 seconds, then do that and celebrate your victories.  With any changes in life,  you will have set backs, and that is OK. The important thing to remember is, if you have a set back, it is not a failure, it is an opportunity to try again!  Health Maintenance Recommendations Screening Testing Mammogram Every 1 -2 years based on history and risk factors Starting at age 56 Pap Smear Ages 21-39 every 3 years Ages 73-65 every 5 years with HPV testing More frequent testing may be required based on results and history Colon Cancer Screening Every 1-10 years based on test performed, risk factors, and history Starting at age 73 Bone Density Screening Every 2-10 years based on history Starting at age 18 for women Recommendations for men differ based on medication usage, history, and risk factors AAA Screening One time ultrasound Men 55-42 years old who have every smoked Lung Cancer Screening Low Dose Lung CT every 12 months Age 78-80 years with a 30 pack-year smoking history who still smoke or who have quit within the last 15 years  Screening Labs Routine  Labs: Complete Blood Count (CBC), Complete Metabolic Panel (CMP), Cholesterol (Lipid Panel) Every 6-12 months based on history and medications May be recommended more frequently based on current conditions or previous results Hemoglobin A1c Lab Every 3-12 months based on history and previous results Starting at age 22 or earlier with diagnosis of diabetes, high cholesterol, BMI >26, and/or  risk factors Frequent monitoring for patients with diabetes to ensure blood sugar control Thyroid Panel (TSH w/ T3 & T4) Every 6 months based on history, symptoms, and risk factors May be repeated more often if on medication HIV One time testing for all patients 13 and older May be repeated more frequently for patients with increased risk factors or exposure Hepatitis C One time testing for all patients 60 and older May be repeated more frequently for patients with increased risk factors or exposure Gonorrhea, Chlamydia Every  12 months for all sexually active persons 13-24 years Additional monitoring may be recommended for those who are considered high risk or who have symptoms PSA Men 72-58 years old with risk factors Additional screening may be recommended from age 44-69 based on risk factors, symptoms, and history  Vaccine Recommendations Tetanus Booster All adults every 10 years Flu Vaccine All patients 6 months and older every year COVID Vaccine All patients 12 years and older Initial dosing with booster May recommend additional booster based on age and health history HPV Vaccine 2 doses all patients age 34-26 Dosing may be considered for patients over 26 Shingles Vaccine (Shingrix) 2 doses all adults 55 years and older Pneumonia (Pneumovax 23) All adults 65 years and older May recommend earlier dosing based on health history Pneumonia (Prevnar 61) All adults 65 years and older Dosed 1 year after Pneumovax 23  Additional Screening, Testing, and Vaccinations may be recommended on an individualized basis based on family history, health history, risk factors, and/or exposure.

## 2024-02-29 ENCOUNTER — Telehealth: Admitting: Physician Assistant

## 2024-02-29 DIAGNOSIS — J22 Unspecified acute lower respiratory infection: Secondary | ICD-10-CM | POA: Diagnosis not present

## 2024-02-29 MED ORDER — BENZONATATE 100 MG PO CAPS: 100.0000 mg | ORAL_CAPSULE | Freq: Two times a day (BID) | ORAL | 0 refills | Status: AC | PRN

## 2024-02-29 MED ORDER — AZITHROMYCIN 250 MG PO TABS: ORAL_TABLET | ORAL | 0 refills | Status: AC

## 2024-02-29 MED ORDER — FLUCONAZOLE 150 MG PO TABS: 150.0000 mg | ORAL_TABLET | Freq: Once | ORAL | 0 refills | Status: AC

## 2024-02-29 NOTE — Patient Instructions (Signed)
" °  Mitzie LOISE Night, thank you for joining Teena Shuck, PA-C for today's virtual visit.  While this provider is not your primary care provider (PCP), if your PCP is located in our provider database this encounter information will be shared with them immediately following your visit.   A [facility] MyChart account gives you access to today's visit and all your visits, tests, and labs performed at [facility]  click here if you don't have a [facility] MyChart account or go to [email or web address]  Consent: (Patient) Miranda Valentine [facility] provided verbal consent for this virtual visit at the beginning of the encounter.  Current Medications:  Current Outpatient Medications:    Biotin 1 MG CAPS, Take by mouth at bedtime. , Disp: , Rfl:    desvenlafaxine  (PRISTIQ ) 100 MG 24 hr tablet, Take 1 tablet (100 mg total) by mouth every morning., Disp: 90 tablet, Rfl: 3   estradiol  (ESTRACE ) 0.5 MG tablet, Take 1 tablet (0.5 mg total) by mouth daily., Disp: 90 tablet, Rfl: 3   Multiple Vitamins-Minerals (MULTIVITAMINS THER. W/MINERALS) TABS, Take 1 tablet by mouth at bedtime. , Disp: , Rfl:    omeprazole  (PRILOSEC) 10 MG capsule, Take 1 capsule (10 mg total) by mouth daily., Disp: 90 capsule, Rfl: 3   propranolol  (INDERAL ) 10 MG tablet, Take 1 tablet (10 mg total) by mouth 3 (three) times daily., Disp: 270 tablet, Rfl: 3   rizatriptan  (MAXALT ) 10 MG tablet, Take 1 tablet (10 mg total) by mouth as needed for migraine. May repeat in 2 hours if needed. No more than 2 doses in 24 hours., Disp: 15 tablet, Rfl: 3   sodium chloride  (OCEAN) 0.65 % SOLN nasal spray, Place 1 spray into both nostrils as needed for congestion., Disp: , Rfl:    Vitamin D , Ergocalciferol , (DRISDOL ) 1.25 MG (50000 UNIT) CAPS capsule, Take 1 capsule (50,000 Units total) by mouth every 14 (fourteen) days., Disp: 6 capsule, Rfl: 1   XIIDRA 5 % SOLN, Apply 1 drop to eye 2 (two) times daily., Disp: , Rfl:    zaleplon  (SONATA ) 10 MG  capsule, Take 1 capsule (10 mg total) by mouth at bedtime as needed., Disp: 90 capsule, Rfl: 1   Medications ordered in this encounter:  No orders of the defined types were placed in this encounter.    *If you need refills on other medications prior to your next appointment, please contact your pharmacy*  Follow-Up: Call back or seek an in-person evaluation if the symptoms worsen or if the condition fails to improve as anticipated.     Other Instructions Follow up with primary provider in 24-48 hours. Report to nearest ER with any worsening symptoms.    If you have been instructed to have an in-person evaluation today at a local Urgent Care facility, please use the link below. It will take you to a list of all of our available [facility], including address, phone number and hours of operation. Please do not delay care.  [facility]  If you or a family member do not have a primary care provider, use the link below to schedule a visit and establish care. When you choose a [facility] primary care physician or advanced practice provider, you gain a long-term partner in health. Find a Primary Care Provider  Learn more about [facility]'s in-office and virtual care options: [facility] - Get Care Now  "

## 2024-03-03 ENCOUNTER — Encounter: Payer: Self-pay | Admitting: Family Medicine

## 2024-03-03 ENCOUNTER — Ambulatory Visit: Admitting: Family Medicine

## 2024-03-03 VITALS — BP 134/76 | HR 80 | Temp 97.4°F | Ht 62.0 in | Wt 156.4 lb

## 2024-03-03 DIAGNOSIS — J019 Acute sinusitis, unspecified: Secondary | ICD-10-CM

## 2024-03-03 NOTE — Patient Instructions (Signed)
 Stay well hydrated. Take 12 hour guaifenesin (Mucinex, plain) twice daily. Continue to use the tessalon  if needed to suppress the cough (mainly at night). Try sinus rinses (sinus rinse kit by [employer] or Neti-pot) once or twice daily. Contact us  at any point if symptoms worsen. Otherwise, reach out next Wednesday (day 10, with day 1 being the first day of the z-pak on 2/2) if you aren't 100% better.  If you are still having symptoms, we should send in a refill of the zpack. Continue to use the albuterol if needed for wheezing.

## 2024-03-03 NOTE — Progress Notes (Signed)
 Chief Complaint  Patient presents with   Nasal Congestion    Head and chest congestion since 02/15/24. Started with ST, just scratchy now (red when she looks at it). Started with a lot of PND. No fever, chills or body aches. Coughing, more productive the last few days, mucus is yellow/green in color.    She started with a sore throat 1/19, then developed a lot of PND (has frequently related to allergies).  ST persisted, eventually developed head congestion and then moved into her chest. She seemed to be doing a little better last weekend, but then woke up 2/2 feeling worse. She did a telehealth visit 2/2 and was prescribed z-pak and tessalon , and advised her to f/u with ED if not feeling considerably better by mid-week. She presents today, as she doesn't feel like she has made much progress (but didn't want to go to ER). She states the last time she had something similar was 04/2023, when she was out of town. She was treated with 1 dose of dexamethasone  and she felt better  She has been using a nasal saline spray/rinse, fluticasone  (since PND started). She took Mucinex for a couple of days, but ran out.  She is not getting much mucus from nose, is mostly PND--expectorated phlegm is thick, yellow-green, throughout the day. The streaks of blood that she noted last week has resolved. The color has gotten slightly lighter as well. The tightness in her chest is loosening up. Tessalon  is helping with her cough at night. She had some wheezing (has allergy-induced wheezing), and used albuterol a few times which helped. She last used this 2 nights ago.  She denies DOE.  +sick contact (her husband was also sick)  PMH, PSH, SH reviewed  Outpatient Encounter Medications as of 03/03/2024  Medication Sig Note   azithromycin  (ZITHROMAX ) 250 MG tablet Take 2 tablets on day 1, then 1 tablet daily on days 2 through 5    benzonatate  (TESSALON ) 100 MG capsule Take 1 capsule (100 mg total) by mouth 2 (two) times  daily as needed for cough. 03/03/2024: Last dose last night   Biotin 1 MG CAPS Take by mouth at bedtime.     desvenlafaxine  (PRISTIQ ) 100 MG 24 hr tablet Take 1 tablet (100 mg total) by mouth every morning.    estradiol  (ESTRACE ) 0.5 MG tablet Take 1 tablet (0.5 mg total) by mouth daily.    fluticasone  (FLONASE ) 50 MCG/ACT nasal spray Place 2 sprays into both nostrils daily.    Multiple Vitamins-Minerals (MULTIVITAMINS THER. W/MINERALS) TABS Take 1 tablet by mouth at bedtime.     omeprazole  (PRILOSEC) 10 MG capsule Take 1 capsule (10 mg total) by mouth daily.    propranolol  (INDERAL ) 10 MG tablet Take 1 tablet (10 mg total) by mouth 3 (three) times daily.    rizatriptan  (MAXALT ) 10 MG tablet Take 1 tablet (10 mg total) by mouth as needed for migraine. May repeat in 2 hours if needed. No more than 2 doses in 24 hours. 03/03/2024: As needed   sodium chloride  (OCEAN) 0.65 % SOLN nasal spray Place 1 spray into both nostrils as needed for congestion.    Vitamin D , Ergocalciferol , (DRISDOL ) 1.25 MG (50000 UNIT) CAPS capsule Take 1 capsule (50,000 Units total) by mouth every 14 (fourteen) days.    XIIDRA 5 % SOLN Apply 1 drop to eye 2 (two) times daily.    zaleplon  (SONATA ) 10 MG capsule Take 1 capsule (10 mg total) by mouth at bedtime as needed. 03/03/2024:  As needed   No facility-administered encounter medications on file as of 03/03/2024.   No Known Allergies  ROS: denies any f/c.  URI symptoms per HPI. Wheezing some, not in the last couple of days. No n/v/d, urinary complaints, bruising or rashes. Denies exertional CP or DOE. Chest congestion is improving.    PHYSICAL EXAM:  BP 134/76   Pulse 80   Temp (!) 97.4 F (36.3 C) (Tympanic)   Ht 5' 2 (1.575 m)   Wt 156 lb 6.4 oz (70.9 kg)   LMP 01/28/1984   BMI 28.61 kg/m   Pleasant, well-appearing female who sounds congested, in no distress.  No throat-clearing or coughing during visit HEENT: conjunctiva and sclera are clear, EOMI. TM's and  EAC's normal bilaterally Nasal mucosa is mildly edematous, with some inflammation on the left, but no purulence. OP with mild erythema posteriorly, otherwise normal mucosa Sinuses are nontender Neck: no lymphadenopathy or mass Heart: regular rate and rhythm, no murmur Lungs: clear bilaterally, no wheezing, rales, ronchi Neuro: alert and oriented, cranial nerves grossly intact, normal gait Psych: normal mood, affect, hygiene and grooming   ASSESSMENT/PLAN:  Acute non-recurrent sinusitis, unspecified location - on day 3 of azithromycin , with slight improvement noted. Complete course, along with mucinex, sinus rinses.  contact us  next week if RF needed, sooner if worse  Stay well hydrated. Take 12 hour guaifenesin (Mucinex, plain) twice daily. Continue to use the tessalon  if needed to suppress the cough (mainly at night). Try sinus rinses (sinus rinse kit by [employer] or Neti-pot) once or twice daily. Contact us  at any point if symptoms worsen. Otherwise, reach out next Wednesday (day 10, with day 1 being the first day of the z-pak on 2/2) if you aren't 100% better.  If you are still having symptoms, we should send in a refill of the zpack. Continue to use the albuterol if needed for wheezing.

## 2024-04-19 ENCOUNTER — Encounter

## 2024-04-19 DIAGNOSIS — Z1231 Encounter for screening mammogram for malignant neoplasm of breast: Secondary | ICD-10-CM

## 2024-10-18 ENCOUNTER — Ambulatory Visit (HOSPITAL_BASED_OUTPATIENT_CLINIC_OR_DEPARTMENT_OTHER): Admitting: Obstetrics & Gynecology

## 2025-02-14 ENCOUNTER — Encounter: Admitting: Nurse Practitioner
